# Patient Record
Sex: Male | Born: 1980 | Race: White | Hispanic: No | Marital: Single | Smoking: Current some day smoker
Health system: Southern US, Community
[De-identification: ages and names within clinical notes are randomized; demographics above are authoritative.]

## PROBLEM LIST (undated history)

## (undated) DIAGNOSIS — M109 Gout, unspecified: Secondary | ICD-10-CM

## (undated) DIAGNOSIS — F419 Anxiety disorder, unspecified: Secondary | ICD-10-CM

## (undated) DIAGNOSIS — I10 Essential (primary) hypertension: Secondary | ICD-10-CM

## (undated) HISTORY — DX: Anxiety disorder, unspecified: F41.9

## (undated) HISTORY — PX: APPENDECTOMY: SHX54

## (undated) HISTORY — DX: Essential (primary) hypertension: I10

---

## 2007-04-12 ENCOUNTER — Emergency Department: Payer: Self-pay | Admitting: Unknown Physician Specialty

## 2008-12-17 ENCOUNTER — Emergency Department: Payer: Self-pay | Admitting: Emergency Medicine

## 2011-02-21 ENCOUNTER — Emergency Department (HOSPITAL_COMMUNITY)
Admission: EM | Admit: 2011-02-21 | Discharge: 2011-02-21 | Disposition: A | Discharge status: Home / Self Care | Payer: Self-pay | Attending: Emergency Medicine | Admitting: Emergency Medicine

## 2011-02-21 DIAGNOSIS — R109 Unspecified abdominal pain: Secondary | ICD-10-CM | POA: Insufficient documentation

## 2011-02-21 DIAGNOSIS — R10819 Abdominal tenderness, unspecified site: Secondary | ICD-10-CM | POA: Insufficient documentation

## 2011-02-21 LAB — CBC
HCT: 46.3 % (ref 39.0–52.0)
Hemoglobin: 16.7 g/dL (ref 13.0–17.0)
MCHC: 36.1 g/dL — ABNORMAL HIGH (ref 30.0–36.0)
WBC: 9.3 10*3/uL (ref 4.0–10.5)

## 2011-02-21 LAB — DIFFERENTIAL
Basophils Absolute: 0 10*3/uL (ref 0.0–0.1)
Lymphocytes Relative: 29 % (ref 12–46)
Lymphs Abs: 2.7 10*3/uL (ref 0.7–4.0)
Monocytes Absolute: 1 10*3/uL (ref 0.1–1.0)
Neutro Abs: 5.3 10*3/uL (ref 1.7–7.7)

## 2011-02-21 LAB — COMPREHENSIVE METABOLIC PANEL
ALT: 81 U/L — ABNORMAL HIGH (ref 0–53)
AST: 48 U/L — ABNORMAL HIGH (ref 0–37)
Albumin: 4.4 g/dL (ref 3.5–5.2)
Alkaline Phosphatase: 126 U/L — ABNORMAL HIGH (ref 39–117)
Chloride: 96 mEq/L (ref 96–112)
GFR calc Af Amer: >60 mL/min (ref >60)
Potassium: 4.2 mEq/L (ref 3.5–5.1)
Total Bilirubin: 0.9 mg/dL (ref 0.3–1.2)

## 2011-02-21 LAB — URINALYSIS, ROUTINE W REFLEX MICROSCOPIC
Glucose, UA: NEGATIVE mg/dL
Hgb urine dipstick: NEGATIVE
Specific Gravity, Urine: 1.024 (ref 1.005–1.030)

## 2011-02-21 LAB — LIPASE, BLOOD: Lipase: 24 U/L (ref 11–59)

## 2012-05-11 ENCOUNTER — Emergency Department: Payer: Self-pay | Admitting: Emergency Medicine

## 2012-07-17 ENCOUNTER — Emergency Department: Payer: Self-pay | Admitting: Emergency Medicine

## 2012-08-03 ENCOUNTER — Emergency Department: Payer: Self-pay | Admitting: Emergency Medicine

## 2013-12-03 ENCOUNTER — Emergency Department (HOSPITAL_COMMUNITY)
Admission: EM | Admit: 2013-12-03 | Discharge: 2013-12-03 | Disposition: A | Discharge status: Home / Self Care | Payer: Self-pay | Attending: Emergency Medicine | Admitting: Emergency Medicine

## 2013-12-03 ENCOUNTER — Encounter (HOSPITAL_COMMUNITY): Payer: Self-pay | Admitting: Emergency Medicine

## 2013-12-03 ENCOUNTER — Emergency Department (HOSPITAL_COMMUNITY): Payer: Self-pay

## 2013-12-03 DIAGNOSIS — IMO0001 Reserved for inherently not codable concepts without codable children: Secondary | ICD-10-CM | POA: Insufficient documentation

## 2013-12-03 DIAGNOSIS — M25531 Pain in right wrist: Secondary | ICD-10-CM

## 2013-12-03 DIAGNOSIS — F17200 Nicotine dependence, unspecified, uncomplicated: Secondary | ICD-10-CM | POA: Insufficient documentation

## 2013-12-03 DIAGNOSIS — M25439 Effusion, unspecified wrist: Secondary | ICD-10-CM | POA: Insufficient documentation

## 2013-12-03 DIAGNOSIS — G479 Sleep disorder, unspecified: Secondary | ICD-10-CM | POA: Insufficient documentation

## 2013-12-03 DIAGNOSIS — M109 Gout, unspecified: Secondary | ICD-10-CM | POA: Insufficient documentation

## 2013-12-03 DIAGNOSIS — M25539 Pain in unspecified wrist: Secondary | ICD-10-CM | POA: Insufficient documentation

## 2013-12-03 MED ORDER — PREDNISONE 10 MG PO TABS: 40.0000 mg | ORAL_TABLET | Freq: Every day | ORAL | Status: DC

## 2013-12-03 MED ORDER — OXYCODONE-ACETAMINOPHEN 5-325 MG PO TABS
1.0000 | ORAL_TABLET | Freq: Once | ORAL | Status: AC
  Administered 2013-12-03: 1 via ORAL
  Filled 2013-12-03: qty 1

## 2013-12-03 MED ORDER — OXYCODONE-ACETAMINOPHEN 5-325 MG PO TABS: 1.0000 | ORAL_TABLET | ORAL | Status: DC | PRN

## 2013-12-03 MED ORDER — PREDNISONE 20 MG PO TABS
60.0000 mg | ORAL_TABLET | Freq: Once | ORAL | Status: AC
  Administered 2013-12-03: 60 mg via ORAL
  Filled 2013-12-03: qty 3

## 2013-12-03 MED ORDER — INDOMETHACIN 25 MG PO CAPS: 25.0000 mg | ORAL_CAPSULE | Freq: Three times a day (TID) | ORAL | Status: DC | PRN

## 2013-12-03 NOTE — ED Provider Notes (Signed)
CSN: [id number]     Arrival date & time 12/03/13  8469 History  This chart was scribed for non-physician practitioner Jaynie Crumble, PA-C, working with Gavin Pound. Oletta Lamas, MD, by Yevette Edwards, ED Scribe. This patient was seen in room TR05C/TR05C and the patient's care was started at 11:09 AM.  First MD Initiated Contact with Patient 12/03/13 1021     Chief Complaint  Patient presents with  . Wrist Pain   The history is provided by the patient. No language interpreter was used.   HPI Comments: Jackson Mcguire is a 32 y.o. male, with a h/o self-diagnosed gout, who presents to the Emergency Department complaining of gradually-worsening right-wrist pain which has persisted for three days. He characterizes the pain as "throbbing," rates the pain as 10/10, and he states the pain as interrupted his sleep. The pt states he also experienced swelling, redness, and a fever blister to the wrist. He treated the pain with 800 mg IBU and he has worn a splint for stability. He denies any recent injuries to his wrist. He states the pain is similar to previous episodes of gout; however, he normally experiences gout to his toes. The pt reports he had experienced similar symptoms 18 months ago. He was treated at [facility] and [facility] without a final diagnosis. He was referred to a rheumatologist, but he did not follow-up because the symptoms spontaneously resolved and he had work commitments. He is a daily smoker.   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 The pt is left-handed.   History reviewed. No pertinent past medical history. History reviewed. No pertinent past surgical history. History reviewed. No pertinent family history. History  Substance Use Topics  . Smoking status: Current Every Day Smoker  . Smokeless tobacco: Not on file  . Alcohol Use: Yes    Review of Systems  Musculoskeletal: Positive for myalgias.  Psychiatric/Behavioral: Positive for sleep disturbance.  All other systems reviewed and are  negative.   Allergies  Review of patient's allergies indicates no known allergies.  Home Medications  No current outpatient prescriptions on file.  Triage Vitals: BP 157/109  Pulse 109  Temp(Src) 97.9 F (36.6 C) (Oral)  Resp 18  Ht 5\' 10"  (1.778 m)  Wt 195 lb (88.451 kg)  BMI 27.98 kg/m2  SpO2 100%  Physical Exam  Nursing note and vitals reviewed. Constitutional: He is oriented to person, place, and time. He appears well-developed and well-nourished. No distress.  HENT:  Head: Normocephalic and atraumatic.  Eyes: EOM are normal.  Neck: Neck supple. No tracheal deviation present.  Cardiovascular: Normal rate.   Pulmonary/Chest: Effort normal. No respiratory distress.  Musculoskeletal: Normal range of motion.  Swelling noted over dorsal and anterior right wrist. There is mild erythema over anterior wrist joint. Tender to palpation over entire wrist. Pain with flexion and extension. Tenderness even to light touch. Normal hand and elbow.  Neurological: He is alert and oriented to person, place, and time.  Skin: Skin is warm and dry.  Psychiatric: He has a normal mood and affect. His behavior is normal.    ED Course  Procedures (including critical care time)  DIAGNOSTIC STUDIES: Oxygen Saturation is 100% on room air, normal by my interpretation.    COORDINATION OF CARE:  11:16 AM- Discussed treatment plan with patient, which includes imaging and pain medication, and the patient agreed to the plan.   12:00 PM- Rechecked pt. Informed pt to continue to wear brace and to follow-up with an orthopedic.   Labs  Review Labs Reviewed - No data to display Imaging Review Dg Wrist Complete Right  12/03/2013   CLINICAL DATA:  Right wrist pain and swelling.  No known injury  EXAM: RIGHT WRIST - COMPLETE 3+ VIEW  COMPARISON:  None.  FINDINGS: Four views of the right wrist submitted. No acute fracture or subluxation. Mild narrowing of radiocarpal joint space.  IMPRESSION: No acute  fracture or subluxation. Mild narrowing of radiocarpal joint space.   Electronically Signed   By: Natasha Mead M.D.   On: 12/03/2013 11:49    EKG Interpretation   None       MDM   1. Wrist pain, acute, right   2. Gout     Patient with a nontraumatic right wrist pain and swelling. History of the same. He is afebrile, doubt infectious joint. States he gets frequent gout flare in the right great toe but unsure of this is related. He did not have any relief with ibuprofen at home. X-rays obtained and are negative. Given exam finding and severity of pain and swelling, most likely gout. Instructed to followup with an orthopedic doctor or primary care Dr. for further evaluation.  Filed Vitals:   12/03/13 0930 12/03/13 1146  BP: 157/109 138/95  Pulse: 109 95  Temp: 97.9 F (36.6 C)   TempSrc: Oral   Resp: 18   Height: 5\' 10"  (1.778 m)   Weight: 195 lb (88.451 kg)   SpO2: 100% 97%    I personally performed the services described in this documentation, which was scribed in my presence. The recorded information has been reviewed and is accurate.    Lottie Mussel, PA-C 12/03/13 1206

## 2013-12-03 NOTE — ED Notes (Signed)
Pt states he started having left wrist pain 1.5 years ago. Went to [facility] and was then referred to [facility]. States they did may tests but did not come up with a dx. States pain "went away" after 2 weeks. Has hurt off and on since then. States started hurting severely 2 days ago. 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 Pain is "sharp", non-radiating, limited to left wrist area.

## 2013-12-03 NOTE — ED Notes (Signed)
Pt c/o right wrist pain x several days; pt sts similar in past; unknown cause and denies obvious injury

## 2013-12-04 NOTE — ED Provider Notes (Signed)
Medical screening examination/treatment/procedure(s) were performed by non-physician practitioner and as supervising physician I was immediately available for consultation/collaboration.  EKG Interpretation   None         Jackson Mcguire. Oletta Lamas, MD 12/04/13 [id number]

## 2013-12-07 ENCOUNTER — Encounter (HOSPITAL_COMMUNITY): Payer: Self-pay | Admitting: Emergency Medicine

## 2013-12-07 ENCOUNTER — Emergency Department (HOSPITAL_COMMUNITY)
Admission: EM | Admit: 2013-12-07 | Discharge: 2013-12-07 | Disposition: A | Discharge status: Home / Self Care | Payer: Self-pay | Attending: Emergency Medicine | Admitting: Emergency Medicine

## 2013-12-07 DIAGNOSIS — F17200 Nicotine dependence, unspecified, uncomplicated: Secondary | ICD-10-CM | POA: Insufficient documentation

## 2013-12-07 DIAGNOSIS — M109 Gout, unspecified: Secondary | ICD-10-CM | POA: Insufficient documentation

## 2013-12-07 MED ORDER — COLCHICINE 0.6 MG PO TABS
1.2000 mg | ORAL_TABLET | Freq: Once | ORAL | Status: AC
  Administered 2013-12-07: 1.2 mg via ORAL
  Filled 2013-12-07: qty 2

## 2013-12-07 MED ORDER — KETOROLAC TROMETHAMINE 60 MG/2ML IM SOLN
60.0000 mg | Freq: Once | INTRAMUSCULAR | Status: AC
  Administered 2013-12-07: 60 mg via INTRAMUSCULAR
  Filled 2013-12-07: qty 2

## 2013-12-07 MED ORDER — COLCHICINE 0.6 MG PO TABS: ORAL_TABLET | ORAL | Status: DC

## 2013-12-07 NOTE — ED Provider Notes (Signed)
CSN: [id number]     Arrival date & time 12/07/13  1716 History  This chart was scribed for non-physician practitioner Irish Elders, NP, working with Audree Camel, MD by Dorothey Baseman, ED Scribe. This patient was seen in room TR06C/TR06C and the patient's care was started at 9:19 PM.   Chief Complaint  Patient presents with  . Gout   The history is provided by the patient. No language interpreter was used.   HPI Comments: Jackson Mcguire is a 32 y.o. male with a history of gout in the right, great toe who presents to the Emergency Department complaining of a constant pain with associated swelling to the right wrist that extends into the fingers that is exacerbated with movement. Patient was seen here 4 days ago for similar complaints and diagnosed with gout in the wrist. Patient was discharged with prednisone, indomethacin, and Percocet, which he states was effective at relieving his symptoms, but that he ran out of the medications and his symptoms have returned. He denies any change in his symptoms. Patient reports that he received a referral for an orthopedist and plans to make an appointment next week. He denies fever, chills, emesis, or any other symptoms at this time. Patient has no other pertinent medical history.   History reviewed. No pertinent past medical history. History reviewed. No pertinent past surgical history. History reviewed. No pertinent family history. History  Substance Use Topics  . Smoking status: Current Every Day Smoker  . Smokeless tobacco: Not on file  . Alcohol Use: Yes    Review of Systems  Constitutional: Negative for fever and chills.  Gastrointestinal: Negative for vomiting.  Musculoskeletal: Positive for arthralgias, joint swelling and myalgias.  All other systems reviewed and are negative.    Allergies  Review of patient's allergies indicates no known allergies.  Home Medications   Current Outpatient Rx  Name  Route  Sig  Dispense  Refill  .  indomethacin (INDOCIN) 25 MG capsule   Oral   Take 1 capsule (25 mg total) by mouth 3 (three) times daily as needed.   30 capsule   0   . oxyCODONE-acetaminophen (PERCOCET) 5-325 MG per tablet   Oral   Take 1 tablet by mouth every 4 (four) hours as needed for severe pain.   20 tablet   0   . predniSONE (DELTASONE) 10 MG tablet   Oral   Take 4 tablets (40 mg total) by mouth daily.   16 tablet   0    Triage Vitals: BP 172/107  Pulse 115  Temp(Src) 98.3 F (36.8 C)  Resp 18  SpO2 98%  Physical Exam  Nursing note and vitals reviewed. Constitutional: He is oriented to person, place, and time. He appears well-developed and well-nourished. No distress.  HENT:  Head: Normocephalic and atraumatic.  Eyes: Conjunctivae are normal.  Neck: Normal range of motion. Neck supple.  Cardiovascular: Normal rate, regular rhythm, normal heart sounds and intact distal pulses.   Brisk capillary refill.   Pulmonary/Chest: Effort normal and breath sounds normal. No respiratory distress.  Abdominal: He exhibits no distension.  Musculoskeletal: Normal range of motion.  Swelling and tenderness to the right wrist and all through his right metacarpals.   Neurological: He is alert and oriented to person, place, and time.  Distal sensation intact.   Skin: Skin is warm and dry.  Psychiatric: He has a normal mood and affect. His behavior is normal.    ED Course  Procedures (including critical care time)  DIAGNOSTIC STUDIES: Oxygen Saturation is 98% on room air, normal by my interpretation.    COORDINATION OF CARE: 9:25 PM- Will order an injection of Toradol. Will discharge patient with colchicine. Discussed treatment plan with patient at bedside and patient verbalized agreement.     Labs Review Labs Reviewed - No data to display Imaging Review No results found.  EKG Interpretation   None       MDM   1. Gout attack     Feeling better after Toradol injection here. Colchicine  prescription given with instructions. Return precautions given. Discussed establishing PCP and follow-up.  I personally performed the services described in this documentation, which was scribed in my presence. The recorded information has been reviewed and is accurate.     Irish Elders, NP 12/22/13 1438

## 2013-12-07 NOTE — ED Notes (Signed)
Per pt sts he was here Monday and dx with gout. sts pain is still there and hand is still swollen

## 2013-12-25 NOTE — ED Provider Notes (Signed)
Medical screening examination/treatment/procedure(s) were performed by non-physician practitioner and as supervising physician I was immediately available for consultation/collaboration.  EKG Interpretation   None         Miro Balderson T Nija Koopman, MD 12/25/13 0051 

## 2014-04-09 ENCOUNTER — Emergency Department (HOSPITAL_COMMUNITY)
Admission: EM | Admit: 2014-04-09 | Discharge: 2014-04-09 | Disposition: A | Discharge status: Home / Self Care | Payer: Self-pay | Attending: Emergency Medicine | Admitting: Emergency Medicine

## 2014-04-09 ENCOUNTER — Encounter (HOSPITAL_COMMUNITY): Payer: Self-pay | Admitting: Emergency Medicine

## 2014-04-09 DIAGNOSIS — M25439 Effusion, unspecified wrist: Secondary | ICD-10-CM | POA: Insufficient documentation

## 2014-04-09 DIAGNOSIS — F17200 Nicotine dependence, unspecified, uncomplicated: Secondary | ICD-10-CM | POA: Insufficient documentation

## 2014-04-09 DIAGNOSIS — IMO0002 Reserved for concepts with insufficient information to code with codable children: Secondary | ICD-10-CM | POA: Insufficient documentation

## 2014-04-09 DIAGNOSIS — M25531 Pain in right wrist: Secondary | ICD-10-CM

## 2014-04-09 DIAGNOSIS — M25539 Pain in unspecified wrist: Secondary | ICD-10-CM | POA: Insufficient documentation

## 2014-04-09 MED ORDER — PROBENECID 500 MG PO TABS: 500.0000 mg | ORAL_TABLET | Freq: Two times a day (BID) | ORAL | Status: DC

## 2014-04-09 MED ORDER — NAPROXEN 500 MG PO TABS: 500.0000 mg | ORAL_TABLET | Freq: Two times a day (BID) | ORAL | Status: DC

## 2014-04-09 MED ORDER — COLCHICINE 0.6 MG PO TABS
1.2000 mg | ORAL_TABLET | Freq: Once | ORAL | Status: AC
  Administered 2014-04-09: 1.2 mg via ORAL
  Filled 2014-04-09: qty 2

## 2014-04-09 MED ORDER — TRAMADOL HCL 50 MG PO TABS: 50.0000 mg | ORAL_TABLET | Freq: Four times a day (QID) | ORAL | Status: DC | PRN

## 2014-04-09 NOTE — ED Provider Notes (Signed)
CSN: [id number]     Arrival date & time 04/09/14  1215 History  This chart was scribed for non-physician practitioner, Emilia BeckKaitlyn Janele Lague, PA-C working with Gavin PoundMichael Y. Oletta LamasGhim, MD by Greggory StallionKayla Andersen, ED scribe. This patient was seen in room TR09C/TR09C and the patient's care was started at 1:17 PM.    Chief Complaint  Patient presents with  . Wrist Pain   The history is provided by the patient. No language interpreter was used.   HPI Comments: Jackson Mcguire is a 33 y.o. male who presents to the Emergency Department complaining of gradual onset, sharp right wrist pain with associated swelling that started a few days ago. He has been seen 2 other times for the same in the last few months and has been told it was gout. Denies injury. States symptoms will flare up randomly. He has not followed up with an orthopedist yet. Pt has used a wrist splint with little relief. Movement and palpation worsen the pain. Pt is left hand dominant.   History reviewed. No pertinent past medical history. History reviewed. No pertinent past surgical history. No family history on file. History  Substance Use Topics  . Smoking status: Current Every Day Smoker  . Smokeless tobacco: Not on file  . Alcohol Use: Yes    Review of Systems  Musculoskeletal: Positive for arthralgias and joint swelling.  All other systems reviewed and are negative.  Allergies  Review of patient's allergies indicates no known allergies.  Home Medications   Prior to Admission medications   Medication Sig Start Date End Date Taking? Authorizing Provider  colchicine 0.6 MG tablet Take 1 tablet every 2-3 hours as needed until pain is controlled. 12/07/13   Irish EldersKelly Walker, NP  indomethacin (INDOCIN) 25 MG capsule Take 1 capsule (25 mg total) by mouth 3 (three) times daily as needed. 12/03/13   Tatyana A Kirichenko, PA-C  oxyCODONE-acetaminophen (PERCOCET) 5-325 MG per tablet Take 1 tablet by mouth every 4 (four) hours as needed for severe pain.  12/03/13   Tatyana A Kirichenko, PA-C  predniSONE (DELTASONE) 10 MG tablet Take 4 tablets (40 mg total) by mouth daily. 12/03/13   Tatyana A Kirichenko, PA-C   BP 148/95  Pulse 92  Temp(Src) 98.2 F (36.8 C) (Oral)  Resp 20  Ht 5\' 10"  (1.778 m)  Wt 195 lb 6.4 oz (88.633 kg)  BMI 28.04 kg/m2  SpO2 99%  Physical Exam  Nursing note and vitals reviewed. Constitutional: He is oriented to person, place, and time. He appears well-developed and well-nourished. No distress.  HENT:  Head: Normocephalic and atraumatic.  Eyes: EOM are normal.  Neck: Neck supple. No tracheal deviation present.  Cardiovascular: Normal rate.   Pulmonary/Chest: Effort normal. No respiratory distress.  Musculoskeletal: Normal range of motion.  Generalized right wrist edema with tenderness to palpation over the volar aspect. Right wrist is slightly warm to touch. No obvious deformity. Limited ROM of right wrist due to pain. Pt is able to make a fist.   Neurological: He is alert and oriented to person, place, and time.  Skin: Skin is warm and dry.  Psychiatric: He has a normal mood and affect. His behavior is normal.    ED Course  Procedures (including critical care time)  DIAGNOSTIC STUDIES: Oxygen Saturation is 99% on RA, normal by my interpretation.    COORDINATION OF CARE: 1:22 PM-Discussed treatment plan which includes colchicine, pain medication and an anti-inflammatory with pt at bedside and pt agreed to plan. Will give pt orthopedic referral  and advised him to follow up.   Labs Review Labs Reviewed - No data to display  Imaging Review No results found.   EKG Interpretation None      MDM   Final diagnoses:  Right wrist pain    1:36 PM Patient likely has gout of the right wrist. Patient has a history of gout and states this feels the same. Patient has no injury. Patient will have ortho follow up and tramadol, naprosyn, and probenacid. No neurovascular compromise.   I personally performed  the services described in this documentation, which was scribed in my presence. The recorded information has been reviewed and is accurate.  Emilia BeckKaitlyn Emerita Berkemeier, PA-C 04/09/14 1337

## 2014-04-09 NOTE — Discharge Instructions (Signed)
Take Probenacid for gout as directed until gone. Take Tramadol as needed for pain. Take Naprosyn as needed for inflammation. Follow up with the recommended Orthopedic doctor for further evaluation.

## 2014-04-09 NOTE — ED Notes (Signed)
Rt wrist pain that he has had for a while has taken meds  But it has helped but now it is worse ,wearing a brace states has no insurance

## 2014-04-11 NOTE — ED Provider Notes (Signed)
Medical screening examination/treatment/procedure(s) were performed by non-physician practitioner and as supervising physician I was immediately available for consultation/collaboration.  Javia Dillow Y. Jensyn Shave, MD 04/11/14 0727 

## 2015-12-12 ENCOUNTER — Emergency Department (HOSPITAL_COMMUNITY)
Admission: EM | Admit: 2015-12-12 | Discharge: 2015-12-13 | Disposition: A | Discharge status: Home / Self Care | Payer: Self-pay | Attending: Emergency Medicine | Admitting: Emergency Medicine

## 2015-12-12 ENCOUNTER — Encounter (HOSPITAL_COMMUNITY): Payer: Self-pay | Admitting: Emergency Medicine

## 2015-12-12 DIAGNOSIS — R1110 Vomiting, unspecified: Secondary | ICD-10-CM | POA: Insufficient documentation

## 2015-12-12 DIAGNOSIS — R52 Pain, unspecified: Secondary | ICD-10-CM | POA: Insufficient documentation

## 2015-12-12 DIAGNOSIS — R42 Dizziness and giddiness: Secondary | ICD-10-CM | POA: Insufficient documentation

## 2015-12-12 DIAGNOSIS — R000 Tachycardia, unspecified: Secondary | ICD-10-CM | POA: Insufficient documentation

## 2015-12-12 DIAGNOSIS — R0989 Other specified symptoms and signs involving the circulatory and respiratory systems: Secondary | ICD-10-CM | POA: Insufficient documentation

## 2015-12-12 DIAGNOSIS — F17210 Nicotine dependence, cigarettes, uncomplicated: Secondary | ICD-10-CM | POA: Insufficient documentation

## 2015-12-12 DIAGNOSIS — R6889 Other general symptoms and signs: Secondary | ICD-10-CM

## 2015-12-12 DIAGNOSIS — R0981 Nasal congestion: Secondary | ICD-10-CM | POA: Insufficient documentation

## 2015-12-12 DIAGNOSIS — R05 Cough: Secondary | ICD-10-CM | POA: Insufficient documentation

## 2015-12-12 DIAGNOSIS — R6883 Chills (without fever): Secondary | ICD-10-CM | POA: Insufficient documentation

## 2015-12-12 LAB — BASIC METABOLIC PANEL
ANION GAP: 11 (ref 5–15)
BUN: 10 mg/dL (ref 6–20)
CALCIUM: 9.4 mg/dL (ref 8.9–10.3)
CO2: 26 mmol/L (ref 22–32)
Chloride: 100 mmol/L — ABNORMAL LOW (ref 101–111)
Creatinine, Ser: 1.08 mg/dL (ref 0.61–1.24)
GFR calc Af Amer: >60 mL/min (ref >60)
GFR calc non Af Amer: >60 mL/min (ref >60)
GLUCOSE: 94 mg/dL (ref 65–99)
Potassium: 4.7 mmol/L (ref 3.5–5.1)
Sodium: 137 mmol/L (ref 135–145)

## 2015-12-12 LAB — CBC
HEMATOCRIT: 45.8 % (ref 39.0–52.0)
HEMOGLOBIN: 16 g/dL (ref 13.0–17.0)
MCH: 32.1 pg (ref 26.0–34.0)
MCHC: 34.9 g/dL (ref 30.0–36.0)
MCV: 91.8 fL (ref 78.0–100.0)
Platelets: 181 10*3/uL (ref 150–400)
RBC: 4.99 MIL/uL (ref 4.22–5.81)
RDW: 11.8 % (ref 11.5–15.5)
WBC: 10.2 10*3/uL (ref 4.0–10.5)

## 2015-12-12 LAB — CBG MONITORING, ED: GLUCOSE-CAPILLARY: 85 mg/dL (ref 65–99)

## 2015-12-12 MED ORDER — FENTANYL CITRATE (PF) 100 MCG/2ML IJ SOLN
50.0000 ug | Freq: Once | INTRAMUSCULAR | Status: AC
  Administered 2015-12-12: 50 ug via INTRAVENOUS
  Filled 2015-12-12: qty 2

## 2015-12-12 MED ORDER — SODIUM CHLORIDE 0.9 % IV BOLUS (SEPSIS)
1000.0000 mL | Freq: Once | INTRAVENOUS | Status: AC
  Administered 2015-12-12: 1000 mL via INTRAVENOUS

## 2015-12-12 MED ORDER — ACETAMINOPHEN 500 MG PO TABS
1000.0000 mg | ORAL_TABLET | Freq: Once | ORAL | Status: AC
  Administered 2015-12-12: 1000 mg via ORAL
  Filled 2015-12-12: qty 2

## 2015-12-12 MED ORDER — ONDANSETRON HCL 4 MG/2ML IJ SOLN
4.0000 mg | Freq: Once | INTRAMUSCULAR | Status: AC
  Administered 2015-12-12: 4 mg via INTRAVENOUS
  Filled 2015-12-12: qty 2

## 2015-12-12 NOTE — ED Notes (Signed)
CBG 85

## 2015-12-12 NOTE — ED Provider Notes (Signed)
CSN: [id number]     Arrival date & time 12/12/15  1741 History   First MD Initiated Contact with Patient 12/12/15 2253     Chief Complaint  Patient presents with  . Emesis  . Generalized Body Aches  . Dizziness     (Consider location/radiation/quality/duration/timing/severity/associated sxs/prior Treatment) HPI Jackson Mcguire. is a 34 y.o. male with no significant past medical history who comes in for evaluation of generalized body aches, emesis, dizziness, cough, runny nose and nasal congestion. Patient reports his symptoms started last night after work. He reports spending the night 2 nights ago with a friend who had same symptoms. He reports associated nausea over the past 24 hours without vomiting, upon standing has felt dizzy which resolved after sitting back down. Reports chills and feeling warm at home, but no measured fevers. Denies overt chest pain, shortness of breath, hemoptysis, numbness or weakness, leg swelling, diarrhea or constipation, abd pain, urinary symptoms. Reports decreased fluid intake due to nausea. Has not tried anything to improve symptoms. Nothing makes it better or worse. Has not had flu shot. No other modifying factors.  History reviewed. No pertinent past medical history. History reviewed. No pertinent past surgical history. No family history on file. Social History  Substance Use Topics  . Smoking status: Current Every Day Smoker -- 1.00 packs/day    Types: Cigarettes  . Smokeless tobacco: None  . Alcohol Use: Yes    Review of Systems A 10 point review of systems was completed and was negative except for pertinent positives and negatives as mentioned in the history of present illness     Allergies  Review of patient's allergies indicates no known allergies.  Home Medications   Prior to Admission medications   Medication Sig Start Date End Date Taking? Authorizing Provider  ibuprofen (ADVIL,MOTRIN) 600 MG tablet Take 1 tablet (600 mg total) by  mouth every 6 (six) hours as needed. 12/13/15   Joycie Peek, PA-C  naproxen (NAPROSYN) 500 MG tablet Take 1 tablet (500 mg total) by mouth 2 (two) times daily with a meal. Patient not taking: Reported on 12/12/2015 04/09/14   Emilia Beck, PA-C  ondansetron (ZOFRAN) 4 MG tablet Take 1 tablet (4 mg total) by mouth every 6 (six) hours. 12/13/15   Joycie Peek, PA-C  probenecid (BENEMID) 500 MG tablet Take 1 tablet (500 mg total) by mouth 2 (two) times daily. Patient not taking: Reported on 12/12/2015 04/09/14   Emilia Beck, PA-C  traMADol (ULTRAM) 50 MG tablet Take 1 tablet (50 mg total) by mouth every 6 (six) hours as needed. Patient not taking: Reported on 12/12/2015 04/09/14   Kaitlyn Szekalski, PA-C   BP 145/100 mmHg  Pulse 103  Temp(Src) 99 F (37.2 C) (Oral)  Resp 16  SpO2 100% Physical Exam  Constitutional: He is oriented to person, place, and time. He appears well-developed and well-nourished.  HENT:  Head: Normocephalic and atraumatic.  Mouth/Throat: Oropharynx is clear and moist.  Eyes: Conjunctivae are normal. Pupils are equal, round, and reactive to light. Right eye exhibits no discharge. Left eye exhibits no discharge. No scleral icterus.  Neck: Normal range of motion. Neck supple.  No meningismus or nuchal rigidity.  Cardiovascular: Regular rhythm and normal heart sounds.   Tachycardic  Pulmonary/Chest: Effort normal and breath sounds normal. No respiratory distress. He has no wheezes. He has no rales.  Abdominal: Soft. There is no tenderness.  Musculoskeletal: Normal range of motion. He exhibits no edema or tenderness.  Lymphadenopathy:  He has no cervical adenopathy.  Neurological: He is alert and oriented to person, place, and time. No cranial nerve deficit. Coordination normal.  Cranial Nerves II-XII grossly intact  Skin: Skin is warm. No rash noted.  Moist  Psychiatric: He has a normal mood and affect.  Nursing note and vitals reviewed.   ED  Course  Procedures (including critical care time) Labs Review Labs Reviewed  BASIC METABOLIC PANEL - Abnormal; Notable for the following:    Chloride 100 (*)    All other components within normal limits  URINALYSIS, ROUTINE W REFLEX MICROSCOPIC (NOT AT [facility]) - Abnormal; Notable for the following:    Hgb urine dipstick SMALL (*)    All other components within normal limits  URINE MICROSCOPIC-ADD ON - Abnormal; Notable for the following:    Squamous Epithelial / LPF 0-5 (*)    All other components within normal limits  CBC  CBG MONITORING, ED    Imaging Review Dg Chest 2 View  12/13/2015  CLINICAL DATA:  34 year old male with fever, cough EXAM: CHEST  2 VIEW COMPARISON:  None. FINDINGS: The heart size and mediastinal contours are within normal limits. Both lungs are clear. The visualized skeletal structures are unremarkable. IMPRESSION: No active cardiopulmonary disease. Electronically Signed   By: Elgie Collard M.D.   On: 12/13/2015 01:56   I have personally reviewed and evaluated these images and lab results as part of my medical decision-making.   EKG Interpretation   Date/Time:  Friday December 12 2015 17:59:44 EST Ventricular Rate:  121 PR Interval:  124 QRS Duration: 84 QT Interval:  308 QTC Calculation: 437 R Axis:   -28 Text Interpretation:  Sinus tachycardia Otherwise normal ECG Confirmed by  ZAVITZ  MD, JOSHUA ([id number]) on 12/12/2015 11:02:06 PM     Meds given in ED:  Medications  sodium chloride 0.9 % bolus 1,000 mL (0 mLs Intravenous Stopped 12/13/15 0101)  acetaminophen (TYLENOL) tablet 1,000 mg (1,000 mg Oral Given 12/12/15 2342)  sodium chloride 0.9 % bolus 1,000 mL (0 mLs Intravenous Stopped 12/13/15 0101)  ondansetron (ZOFRAN) injection 4 mg (4 mg Intravenous Given 12/12/15 2340)  fentaNYL (SUBLIMAZE) injection 50 mcg (50 mcg Intravenous Given 12/12/15 2340)  ibuprofen (ADVIL,MOTRIN) tablet 800 mg (800 mg Oral Given 12/13/15 0105)    Discharge  Medication List as of 12/13/2015  1:39 AM    START taking these medications   Details  ibuprofen (ADVIL,MOTRIN) 600 MG tablet Take 1 tablet (600 mg total) by mouth every 6 (six) hours as needed., Starting 12/13/2015, Until Discontinued, Print    ondansetron (ZOFRAN) 4 MG tablet Take 1 tablet (4 mg total) by mouth every 6 (six) hours., Starting 12/13/2015, Until Discontinued, Print       Filed Vitals:   12/13/15 0115 12/13/15 0155 12/13/15 0218 12/13/15 0219  BP: 160/97  145/100   Pulse: 114  98 103  Temp:  99.8 F (37.7 C) 99 F (37.2 C)   TempSrc:  Oral Oral   Resp: 23  16   SpO2: 95%  100% 100%    MDM  Mamie Levers. is a 34 y.o. male with symptoms consistent with influenza.  Vitals are stable, mild tachycardia that improves with fluids, low-grade fever.  No signs of gross dehydration, tolerating PO's.  Lungs are clear. Due to patient's presentation, fever and cough, will obtain screening CXR. CXR is negative. Doubt PE, PNA or other acute cardiopulmonary process. Discussed the cost versus benefit of Tamiflu treatment with the patient.  Decides to try OTC symptom management. Patient will be discharged with instructions to orally hydrate, rest, and use over-the-counter medications such as anti-inflammatories ibuprofen and Aleve for muscle aches and Tylenol for fever.   Prior to patient discharge, I discussed and reviewed this case with Dr.Zavitz, who agrees with above plan. The patient appears reasonably screened and/or stabilized for discharge and I doubt any other medical condition or other [facility] requiring further screening, evaluation, or treatment in the ED at this time prior to discharge.   Final diagnoses:  Flu-like symptoms       Joycie PeekBenjamin Marcey Persad, PA-C 12/14/15 1151  Blane OharaJoshua Zavitz, MD 12/16/15 Ernestina [id number]

## 2015-12-12 NOTE — ED Notes (Addendum)
Pt from home with c/o emesis, body aches, and intermittent dizziness that started yyesterday, states dizziness gets worse when he moves around and has been feeling like he is going to pass out all day. Pt states cold chills and not feeling well. Pt in nad, no fever at triage. Also reports nasal congestion.

## 2015-12-13 ENCOUNTER — Emergency Department (HOSPITAL_COMMUNITY): Payer: Self-pay

## 2015-12-13 LAB — URINE MICROSCOPIC-ADD ON
BACTERIA UA: NONE SEEN
WBC, UA: NONE SEEN WBC/hpf (ref 0–5)

## 2015-12-13 LAB — URINALYSIS, ROUTINE W REFLEX MICROSCOPIC
Bilirubin Urine: NEGATIVE
Glucose, UA: NEGATIVE mg/dL
Ketones, ur: NEGATIVE mg/dL
LEUKOCYTES UA: NEGATIVE
NITRITE: NEGATIVE
PROTEIN: NEGATIVE mg/dL
Specific Gravity, Urine: 1.028 (ref 1.005–1.030)
pH: 6 (ref 5.0–8.0)

## 2015-12-13 MED ORDER — IBUPROFEN 800 MG PO TABS
800.0000 mg | ORAL_TABLET | Freq: Once | ORAL | Status: AC
  Administered 2015-12-13: 800 mg via ORAL
  Filled 2015-12-13: qty 1

## 2015-12-13 MED ORDER — ONDANSETRON HCL 4 MG PO TABS: 4.0000 mg | ORAL_TABLET | Freq: Four times a day (QID) | ORAL | Status: DC

## 2015-12-13 MED ORDER — IBUPROFEN 600 MG PO TABS: 600.0000 mg | ORAL_TABLET | Freq: Four times a day (QID) | ORAL | Status: DC | PRN

## 2015-12-13 NOTE — ED Notes (Signed)
Pt verbalized understanding of d/c instructions and has no further questions. Pt stable and NAD.  

## 2015-12-13 NOTE — ED Notes (Signed)
Pt given ginger ale and crackers to eat and drink for po challenge

## 2015-12-13 NOTE — Discharge Instructions (Signed)
Her symptoms are likely due to a viral process. It is important to stay well hydrated, you may treat your symptoms with OTC medications. He may take your Motrin for body aches, Zofran for nausea. Follow-up with your doctor as needed. Return to ED for any new or worsening symptoms.

## 2016-08-30 ENCOUNTER — Emergency Department (HOSPITAL_COMMUNITY)
Admission: EM | Admit: 2016-08-30 | Discharge: 2016-08-30 | Disposition: A | Discharge status: Home / Self Care | Payer: Self-pay | Attending: Emergency Medicine | Admitting: Emergency Medicine

## 2016-08-30 ENCOUNTER — Encounter (HOSPITAL_COMMUNITY): Payer: Self-pay | Admitting: Vascular Surgery

## 2016-08-30 ENCOUNTER — Emergency Department (HOSPITAL_COMMUNITY): Payer: Self-pay

## 2016-08-30 DIAGNOSIS — M10031 Idiopathic gout, right wrist: Secondary | ICD-10-CM | POA: Insufficient documentation

## 2016-08-30 DIAGNOSIS — F17210 Nicotine dependence, cigarettes, uncomplicated: Secondary | ICD-10-CM | POA: Insufficient documentation

## 2016-08-30 DIAGNOSIS — M109 Gout, unspecified: Secondary | ICD-10-CM

## 2016-08-30 HISTORY — DX: Gout, unspecified: M10.9

## 2016-08-30 MED ORDER — TRAMADOL HCL 50 MG PO TABS: 50.0000 mg | ORAL_TABLET | Freq: Two times a day (BID) | ORAL | 0 refills | Status: DC

## 2016-08-30 MED ORDER — TRAMADOL HCL 50 MG PO TABS
50.0000 mg | ORAL_TABLET | Freq: Once | ORAL | Status: AC
  Administered 2016-08-30: 50 mg via ORAL
  Filled 2016-08-30: qty 1

## 2016-08-30 MED ORDER — NAPROXEN 250 MG PO TABS
500.0000 mg | ORAL_TABLET | Freq: Once | ORAL | Status: AC
  Administered 2016-08-30: 500 mg via ORAL
  Filled 2016-08-30: qty 2

## 2016-08-30 MED ORDER — NAPROXEN 250 MG PO TABS: 250.0000 mg | ORAL_TABLET | Freq: Three times a day (TID) | ORAL | 0 refills | Status: AC

## 2016-08-30 NOTE — ED Notes (Signed)
Declined W/C at D/C and was escorted to lobby by RN. 

## 2016-08-30 NOTE — Discharge Instructions (Signed)
Read the information below.   Your x-ray did not show any evidence of fracture or dislocation. You are being treated for possible gout. You are being prescribed tramadol and naprosyn for symptomatic relief.  Keep area elevated. Apply heat or ice for 20 minute increments.  Use the prescribed medication as directed.  Please discuss all new medications with your pharmacist.   It is important to establish a primary care provider. I have provided the contact information for [facility] as well as other resources for local providers. Please call to establish care.  You may return to the Emergency Department at any time for worsening condition or any new symptoms that concern you. Return to ED if your symptoms worsen or if you develop a fever, vomiting, numbness, weakness.

## 2016-08-30 NOTE — ED Triage Notes (Signed)
Pt reports to the ED for eval of right wrist pain. Pt reports he has hx of gout and this feels similar. He has had it in his wrist before as well and states this feels the same. Swelling and tenderness noted to the wrist. Denies any recent injury. Pt does not have insurance so he is not on any chronic gout meds.

## 2016-08-30 NOTE — ED Provider Notes (Signed)
[facility]EMER[facility]Y DEPT Provider Note   CSN: [id number] Arrival date & time: 08/30/16  1400  By signing my name below, I, Jackson Mcguire, attest that this documentation has been prepared under the direction and in the presence of Jackson Meres, PA-C. Electronically Signed: Phillis Mcguire, ED Scribe. 08/30/16. 5:07 PM.  History   Chief Complaint Chief Complaint  Patient presents with  . Gout   The history is provided by the patient. No language interpreter was used.   HPI Comments: Jackson Mcguire. is a 35 y.o. male with a hx of gout who presents to the Emergency Department complaining of gradually worsening right wrist pain onset one day ago. Pt reports associated swelling to the area. He has worsening pain with any sort of movement or palpation of the area. Pt reports that this pain feels similar to past gout flare ups in the wrist, but this is the worst he's experienced. He said he was recently on a beach trip and his diet over the trip may have contributed to the flare up. He said that he has also experienced gout in his right ankle and toes on right foot over the past month. Pt has been taking ibuprofen and BC powders for his symptoms to no relief; his last dose was at 9 AM today. Pt is not on chronic gout medication due to lack of insurance. He denies hx of DM, hx of cancer, hx of HIV, recent surgeries, open wounds, fever, chills, congestion, nausea, vomiting, numbness, or weakness. Pt is left hand dominant.  Past Medical History:  Diagnosis Date  . Gout     There are no active problems to display for this patient.   History reviewed. No pertinent surgical history.    Home Medications    Prior to Admission medications   Medication Sig Start Date End Date Taking? Authorizing Provider  naproxen (NAPROSYN) 250 MG tablet Take 1 tablet (250 mg total) by mouth every 8 (eight) hours. 08/30/16 09/04/16  Lona Kettle, PA-C  traMADol (ULTRAM) 50 MG tablet Take 1 tablet (50 mg total) by  mouth 2 (two) times daily. 08/30/16   Lona Kettle, PA-C    Family History History reviewed. No pertinent family history.  Social History Social History  Substance Use Topics  . Smoking status: Current Every Day Smoker    Packs/day: 1.00    Types: Cigarettes  . Smokeless tobacco: Never Used  . Alcohol use Yes     Comment: few drinks per night     Allergies   Review of patient's allergies indicates no known allergies.   Review of Systems Review of Systems  Constitutional: Negative for chills and fever.  HENT: Negative for congestion.   Gastrointestinal: Negative for nausea and vomiting.  Musculoskeletal: Positive for arthralgias and joint swelling.     Physical Exam Updated Vital Signs BP 138/97 (BP Location: Left Arm)   Pulse 98   Temp 97.8 F (36.6 C) (Oral)   Resp 20   SpO2 100%   Physical Exam  Constitutional: He is oriented to person, place, and time. He appears well-developed and well-nourished. No distress.  HENT:  Head: Normocephalic and atraumatic.  Eyes: Conjunctivae and EOM are normal. No scleral icterus.  Neck: Normal range of motion. Neck supple.  Cardiovascular: Normal rate.   Pulmonary/Chest: Effort normal. No respiratory distress.  Musculoskeletal:       Right wrist: He exhibits decreased range of motion and swelling.  Swelling to right wrist; slight warmth over the wrist to  palpation; no erythema noted; decreased ROM secondary to pain. Sensation intact. Patient able to move fingers. Radial pulse 2+. Capillary refill <3seconds.   Neurological: He is alert and oriented to person, place, and time. No sensory deficit.  Skin: Skin is warm and dry. Capillary refill takes less than 2 seconds. He is not diaphoretic.  Psychiatric: He has a normal mood and affect. His behavior is normal.  Nursing note and vitals reviewed.    ED Treatments / Results  DIAGNOSTIC STUDIES: Oxygen Saturation is 100% on RA, normal by my interpretation.     COORDINATION OF CARE: 5:06 PM-Discussed treatment plan which includes x-ray with pt at bedside and pt agreed to plan.    Labs (all labs ordered are listed, but only abnormal results are displayed) Labs Reviewed - No data to display  EKG  EKG Interpretation None       Radiology Dg Wrist Complete Right  Result Date: 08/30/2016 CLINICAL DATA:  Severe right wrist pain and tenderness. Decreased range of motion. No known injury. Gout. EXAM: RIGHT WRIST - COMPLETE 3+ VIEW COMPARISON:  None. FINDINGS: There is no evidence of fracture or dislocation. There is no evidence of joint space narrowing or erosive arthropathy. No other focal bone abnormality. Soft tissues are unremarkable. IMPRESSION: Negative. Electronically Signed   By: Jackson RosenthalJohn  Mcguire M.D.   On: 08/30/2016 15:30    Procedures Procedures (including critical care time)  Medications Ordered in ED Medications  traMADol (ULTRAM) tablet 50 mg (50 mg Oral Given 08/30/16 1726)  naproxen (NAPROSYN) tablet 500 mg (500 mg Oral Given 08/30/16 1725)     Initial Impression / Assessment and Plan / ED Course  I have reviewed the triage vital signs and the nursing notes.  Pertinent labs & imaging results that were available during my care of the patient were reviewed by me and considered in my medical decision making (see chart for details).  Clinical Course  Value Comment By Time  DG Wrist Complete Right No evidence of fracture, dislocation, or effusion.  Lona Kettleshley Laurel Meyer, [place]PA-C 09/18 1540    Patient presents to ED with complaint of right wrist pain. Patient is afebrile and non-toxic appearing in NAD. VSS. Swelling, warmth, and tenderness to palpation noted at right wrist. Patient neurovascularly intact. X-ray negative for obvious fracture, dislocation, or effusion. Pt h/o gout. States feels similar to gout. Dietary indiscretion this past weekend. Suspect gout flare up. Pain medicine given in ED. Pt advised to establish care with a PCP  due to continued and frequent flare ups of gout. Conservative therapy recommended and discussed. Rx naprosyn and tramadol. Return precautions given. Pt voiced understanding and is agreeable.   Final Clinical Impressions(s) / ED Diagnoses   Final diagnoses:  Acute gout of right wrist, unspecified cause    I personally performed the services described in this documentation, which was scribed in my presence. The recorded information has been reviewed and is accurate.   New Prescriptions Discharge Medication List as of 08/30/2016  5:26 PM       Lona KettleAshley Laurel Meyer, PA-C 09/01/16 1452    Lyndal Pulleyaniel Knott, MD 09/04/16 [phone]

## 2016-09-27 ENCOUNTER — Encounter (HOSPITAL_COMMUNITY): Payer: Self-pay | Admitting: Emergency Medicine

## 2016-09-27 ENCOUNTER — Emergency Department (HOSPITAL_COMMUNITY)
Admission: EM | Admit: 2016-09-27 | Discharge: 2016-09-27 | Disposition: A | Discharge status: Home / Self Care | Payer: Self-pay | Attending: Emergency Medicine | Admitting: Emergency Medicine

## 2016-09-27 ENCOUNTER — Emergency Department (HOSPITAL_COMMUNITY): Payer: Self-pay

## 2016-09-27 DIAGNOSIS — I10 Essential (primary) hypertension: Secondary | ICD-10-CM | POA: Insufficient documentation

## 2016-09-27 DIAGNOSIS — X509XXA Other and unspecified overexertion or strenuous movements or postures, initial encounter: Secondary | ICD-10-CM | POA: Insufficient documentation

## 2016-09-27 DIAGNOSIS — M25061 Hemarthrosis, right knee: Secondary | ICD-10-CM | POA: Insufficient documentation

## 2016-09-27 DIAGNOSIS — Y9301 Activity, walking, marching and hiking: Secondary | ICD-10-CM | POA: Insufficient documentation

## 2016-09-27 DIAGNOSIS — M2500 Hemarthrosis, unspecified joint: Secondary | ICD-10-CM

## 2016-09-27 DIAGNOSIS — M25461 Effusion, right knee: Secondary | ICD-10-CM | POA: Insufficient documentation

## 2016-09-27 DIAGNOSIS — Y999 Unspecified external cause status: Secondary | ICD-10-CM | POA: Insufficient documentation

## 2016-09-27 DIAGNOSIS — F17210 Nicotine dependence, cigarettes, uncomplicated: Secondary | ICD-10-CM | POA: Insufficient documentation

## 2016-09-27 DIAGNOSIS — Y9289 Other specified places as the place of occurrence of the external cause: Secondary | ICD-10-CM | POA: Insufficient documentation

## 2016-09-27 MED ORDER — MELOXICAM 15 MG PO TABS: 15.0000 mg | ORAL_TABLET | Freq: Every day | ORAL | 0 refills | Status: DC

## 2016-09-27 MED ORDER — BETAMETHASONE SOD PHOS & ACET 6 (3-3) MG/ML IJ SUSP
12.0000 mg | Freq: Once | INTRAMUSCULAR | Status: AC
  Administered 2016-09-27: 12 mg via INTRAMUSCULAR
  Filled 2016-09-27: qty 2

## 2016-09-27 MED ORDER — BUPIVACAINE HCL 0.25 % IJ SOLN
10.0000 mL | Freq: Once | INTRAMUSCULAR | Status: AC
  Administered 2016-09-27: 10 mL
  Filled 2016-09-27: qty 10

## 2016-09-27 NOTE — ED Triage Notes (Signed)
Pt states he fell while taking out trash on Thursday. Pt hit right knee- it is swollen and very painful. Pt able to put pressure on knee, but unable to bend his knee.

## 2016-09-27 NOTE — ED Provider Notes (Addendum)
[facility] Provider Note   CSN: [id number] Arrival date & time: 09/27/16  1010     History   Chief Complaint No chief complaint on file.   HPI Jackson Mcguire. is a 35 y.o. male Presents emergency with chief complaint of right knee pain. Patient noted to be hypertensive states that he has a history of hypertension that is untreated. He is also in significant pain. Patient states that 4 days ago he was walking down the stairs and twisted on a plane knee. He heard a pop and has had significant pain and swelling. He denies any mechanical symptoms such as painful popping, clicking, locking, or feelings of instability in the knee. He has no previous history of pain in the knee. He has history of gout but states that he's never gone in his knee and this occurred secondary to the injury. The penetrating trauma. He denies IV drug use. He complains of severe tightness and swelling in the knee, making ambulation difficult. He denies numbness or tingling in the foot, calf pain or swelling. HPI  Past Medical History:  Diagnosis Date  . Gout     There are no active problems to display for this patient.   History reviewed. No pertinent surgical history.     Home Medications    Prior to Admission medications   Medication Sig Start Date End Date Taking? Authorizing Provider  traMADol (ULTRAM) 50 MG tablet Take 1 tablet (50 mg total) by mouth 2 (two) times daily. 08/30/16   Lona Kettle, PA-C    Family History History reviewed. No pertinent family history.  Social History Social History  Substance Use Topics  . Smoking status: Current Every Day Smoker    Packs/day: 1.00    Types: Cigarettes  . Smokeless tobacco: Never Used  . Alcohol use Yes     Comment: few drinks per night     Allergies   Review of patient's allergies indicates no known allergies.   Review of Systems Review of Systems  Ten systems reviewed and are negative for acute change, except as noted  in the HPI.  Physical Exam Updated Vital Signs BP (!) 153/133 (BP Location: Right Arm) Comment: pt.in alot of pain  Pulse 101   Temp 97.8 F (36.6 C) (Oral)   Resp 17   Ht 5\' 10"  (1.778 m)   Wt 77.1 kg   SpO2 96%   BMI 24.39 kg/m   Physical Exam  Constitutional: He appears well-developed and well-nourished. No distress.  HENT:  Head: Normocephalic and atraumatic.  Eyes: Conjunctivae are normal. No scleral icterus.  Neck: Normal range of motion. Neck supple.  Cardiovascular: Normal rate, regular rhythm and normal heart sounds.   Pulmonary/Chest: Effort normal and breath sounds normal. No respiratory distress.  Abdominal: Soft. There is no tenderness.  Musculoskeletal: He exhibits no edema.  Right knee with Large joint effusion. Crepitus with flexion and extension. ROM limited due to swelling and pain. No No redness or heat.   Neurological: He is alert.  Skin: Skin is warm and dry. He is not diaphoretic.  Psychiatric: His behavior is normal.  Nursing note and vitals reviewed.     ED Treatments / Results  Labs (all labs ordered are listed, but only abnormal results are displayed) Labs Reviewed - No data to display  EKG  EKG Interpretation None       Radiology Dg Knee Complete 4 Views Right  Result Date: 09/27/2016 CLINICAL DATA:  Fall. EXAM: RIGHT KNEE -  COMPLETE 4+ VIEW COMPARISON:  None FINDINGS: Suprapatellar joint effusion identified. No evidence of arthropathy or other focal bone abnormality. Metallic BB is identified within the soft tissues posterior to the distal femur. IMPRESSION: 1. No acute bone abnormality. 2. Small joint effusion. Electronically Signed   By: Signa Kellaylor  Stroud M.D.   On: 09/27/2016 10:55    Procedures .Joint Aspiration/Arthrocentesis Date/Time: 09/27/2016 3:51 PM Performed by: Arthor CaptainHARRIS, Erva Koke Authorized by: Arthor CaptainHARRIS, Quincy Boy   Consent:    Consent obtained:  Verbal   Risks discussed:  Bleeding, infection, pain and incomplete drainage    Alternatives discussed:  No treatment Location:    Location:  Knee Anesthesia (see MAR for exact dosages):    Anesthesia method:  Local infiltration   Local anesthetic:  Bupivacaine 0.25% w/o epi Procedure details:    Needle gauge:  18 G   Ultrasound guidance: no     Approach:  Superior   Aspirate amount:  64   Aspirate characteristics:  Blood-tinged   Steroid injected: yes (3cc celestone)     Specimen collected: no   Post-procedure details:    Dressing:  Adhesive bandage   Patient tolerance of procedure:  Tolerated well, no immediate complications   (including critical care time)  Medications Ordered in ED Medications  bupivacaine (MARCAINE) 0.25 % (with pres) injection 10 mL (not administered)  betamethasone acetate-betamethasone sodium phosphate (CELESTONE) injection 12 mg (not administered)     Initial Impression / Assessment and Plan / ED Course  I have reviewed the triage vital signs and the nursing notes.  Pertinent labs & imaging results that were available during my care of the patient were reviewed by me and considered in my medical decision making (see chart for details).  Clinical Course  Value Comment By Time  DG Knee Complete 4 Views Right (Reviewed) Arthor Captainbigail Rodriques Badie, PA-C 10/16 1337    Images reviewed and negative.  Final Clinical Impressions(s) / ED Diagnoses   Final diagnoses:  None   Patient with knee effusion Treated in the ED with joint aspiration and injection of Celestone and bupivacaine. Patient placed in knee sleeve with crutches. I be discharged with Modic and follow-up with Dr. Ophelia CharterYates. Discussed return precautions. New Prescriptions New Prescriptions   No medications on file     Arthor Captainbigail Bryna Razavi, PA-C 09/27/16 1604    Benjiman CoreNathan Pickering, MD 09/27/16 1611    Arthor CaptainAbigail Adalis Gatti, PA-C 10/23/16 [phone]    Benjiman CoreNathan Pickering, MD 10/23/16 1537

## 2016-09-27 NOTE — Discharge Instructions (Signed)
SEEK IMMEDIATE MEDICAL CARE IF: °You have increased swelling or redness of your knee. °You have severe pain in your knee. °You have a fever. °  °

## 2016-09-27 NOTE — Progress Notes (Signed)
Orthopedic Tech Progress Note Patient Details:  Jackson LeversJim K Branca Jr. 12-Sep-1981 [id number]  Ortho Devices Type of Ortho Device: Knee Sleeve, Crutches Ortho Device/Splint Location: Rt knee Ortho Device/Splint Interventions: Ordered, Application   Clois Dupesvery S Capri Veals 09/27/2016, 4:23 PM

## 2017-04-09 ENCOUNTER — Encounter (HOSPITAL_COMMUNITY): Payer: Self-pay | Admitting: Emergency Medicine

## 2017-04-09 ENCOUNTER — Emergency Department (HOSPITAL_COMMUNITY)
Admission: EM | Admit: 2017-04-09 | Discharge: 2017-04-09 | Disposition: A | Discharge status: Home / Self Care | Payer: Self-pay | Attending: Emergency Medicine | Admitting: Emergency Medicine

## 2017-04-09 DIAGNOSIS — K08409 Partial loss of teeth, unspecified cause, unspecified class: Secondary | ICD-10-CM

## 2017-04-09 DIAGNOSIS — Z98818 Other dental procedure status: Secondary | ICD-10-CM | POA: Insufficient documentation

## 2017-04-09 DIAGNOSIS — K0889 Other specified disorders of teeth and supporting structures: Secondary | ICD-10-CM | POA: Insufficient documentation

## 2017-04-09 DIAGNOSIS — F17210 Nicotine dependence, cigarettes, uncomplicated: Secondary | ICD-10-CM | POA: Insufficient documentation

## 2017-04-09 NOTE — Discharge Instructions (Signed)
Return if any problems.

## 2017-04-09 NOTE — ED Triage Notes (Signed)
Pt states he had his tooth pulled at the dentist yesterday and they didn't give him anything to take home for pain. Pt c/o severe pain and states he cant go to work and needs a doctors note.

## 2017-04-09 NOTE — ED Provider Notes (Signed)
[facility] Provider Note   CSN: [id number] Arrival date & time: 04/09/17  1246  By signing my name below, I, Majel Homer, attest that this documentation has been prepared under the direction and in the presence of non-physician practitioner, Ok Edwards, PA-C. Electronically Signed: Majel Homer, Scribe. 04/09/2017. Marland Kitchen  History   Chief Complaint Chief Complaint  Patient presents with  . Dental Pain   The history is provided by the patient. No language interpreter was used.   HPI Comments: Jackson Mcguire. is a 36 y.o. male who presents to the Emergency Department complaining of gradually worsening, dental pain s/p tooth extraction yesterday afternoon. Pt reports he was not given any pain medication after his procedure yesterday and is now experiencing severe pain. He states he cannot return to work due to his pain and is requesting a work note in the ED. He denies any other complaints.   Past Medical History:  Diagnosis Date  . Gout    There are no active problems to display for this patient.  History reviewed. No pertinent surgical history.  Home Medications    Prior to Admission medications   Medication Sig Start Date End Date Taking? Authorizing Provider  meloxicam (MOBIC) 15 MG tablet Take 1 tablet (15 mg total) by mouth daily. Take 1 daily with food. 09/27/16   Arthor Captain, PA-C  meloxicam (MOBIC) 15 MG tablet Take 1 tablet (15 mg total) by mouth daily. 09/27/16   Arthor Captain, PA-C  traMADol (ULTRAM) 50 MG tablet Take 1 tablet (50 mg total) by mouth 2 (two) times daily. 08/30/16   [name], PA-C    Family History No family history on file.  Social History Social History  Substance Use Topics  . Smoking status: Current Every Day Smoker    Packs/day: 1.00    Types: Cigarettes  . Smokeless tobacco: Never Used  . Alcohol use Yes     Comment: few drinks per night   Allergies   Patient has no known allergies.  Review of Systems Review of  Systems  Physical Exam Updated Vital Signs BP (!) 163/106   Pulse 99   Temp 98.8 F (37.1 C)   Resp 16   Ht  (1.803 m)   Wt 178 lb (80.7 kg)   SpO2 97%   BMI 24.83 kg/m   Physical Exam  Constitutional: He is oriented to person, place, and time. He appears well-developed and well-nourished.  HENT:  Head: Normocephalic.  Sutures to gumline in left lower gums.   Eyes: EOM are normal.  Neck: Normal range of motion.  Pulmonary/Chest: Effort normal.  Abdominal: He exhibits no distension.  Musculoskeletal: Normal range of motion.  Neurological: He is alert and oriented to person, place, and time.  Psychiatric: He has a normal mood and affect.  Nursing note and vitals reviewed.  ED Treatments / Results  DIAGNOSTIC STUDIES:  Oxygen Saturation is 97% on RA, normal by my interpretation.    COORDINATION OF CARE:  1:35 PM Discussed treatment plan with pt at bedside and pt agreed to plan.  Labs (all labs ordered are listed, but only abnormal results are displayed) Labs Reviewed - No data to display  EKG  EKG Interpretation None       Radiology No results found.  Procedures Procedures (including critical care time)  Medications Ordered in ED Medications - No data to display  Initial Impression / Assessment and Plan / ED Course  I have reviewed the triage vital  signs and the nursing notes.  Pertinent labs & imaging results that were available during my care of the patient were reviewed by me and considered in my medical decision making (see chart for details).      I personally performed the services in this documentation, which was scribed in my presence.  The recorded information has been reviewed and considered.   Barnet Pall.   Final Clinical Impressions(s) / ED Diagnoses   Final diagnoses:  Pain, dental  Status post tooth extraction    New Prescriptions Discharge Medication List as of 04/09/2017  1:39 PM    An After Visit Summary was printed  and given to the patient.  I personally performed the services in this documentation, which was scribed in my presence.  The recorded information has been reviewed and considered.   Barnet Pall.   Lonia Skinner Balm, PA-C 04/09/17 1527    Doug Sou, MD 04/09/17 [phone]

## 2017-04-09 NOTE — ED Notes (Signed)
Declined W/C at D/C and was escorted to lobby by RN. 

## 2017-05-21 ENCOUNTER — Encounter (HOSPITAL_COMMUNITY): Payer: Self-pay | Admitting: Emergency Medicine

## 2017-05-21 ENCOUNTER — Emergency Department (HOSPITAL_COMMUNITY)
Admission: EM | Admit: 2017-05-21 | Discharge: 2017-05-21 | Disposition: A | Discharge status: Home / Self Care | Payer: Self-pay | Attending: Emergency Medicine | Admitting: Emergency Medicine

## 2017-05-21 ENCOUNTER — Emergency Department (HOSPITAL_COMMUNITY): Payer: Self-pay

## 2017-05-21 DIAGNOSIS — Z79899 Other long term (current) drug therapy: Secondary | ICD-10-CM | POA: Insufficient documentation

## 2017-05-21 DIAGNOSIS — W108XXA Fall (on) (from) other stairs and steps, initial encounter: Secondary | ICD-10-CM | POA: Insufficient documentation

## 2017-05-21 DIAGNOSIS — Y999 Unspecified external cause status: Secondary | ICD-10-CM | POA: Insufficient documentation

## 2017-05-21 DIAGNOSIS — Y929 Unspecified place or not applicable: Secondary | ICD-10-CM | POA: Insufficient documentation

## 2017-05-21 DIAGNOSIS — S8392XA Sprain of unspecified site of left knee, initial encounter: Secondary | ICD-10-CM | POA: Insufficient documentation

## 2017-05-21 DIAGNOSIS — M25462 Effusion, left knee: Secondary | ICD-10-CM

## 2017-05-21 DIAGNOSIS — F17210 Nicotine dependence, cigarettes, uncomplicated: Secondary | ICD-10-CM | POA: Insufficient documentation

## 2017-05-21 DIAGNOSIS — Y9389 Activity, other specified: Secondary | ICD-10-CM | POA: Insufficient documentation

## 2017-05-21 MED ORDER — BUPIVACAINE HCL 0.25 % IJ SOLN
10.0000 mL | Freq: Once | INTRAMUSCULAR | Status: DC
  Filled 2017-05-21: qty 10

## 2017-05-21 MED ORDER — BUPIVACAINE HCL (PF) 0.25 % IJ SOLN
10.0000 mL | Freq: Once | INTRAMUSCULAR | Status: AC
  Administered 2017-05-21: 10 mL
  Filled 2017-05-21: qty 30

## 2017-05-21 NOTE — ED Provider Notes (Signed)
[facility] Provider Note   CSN: [id number] Arrival date & time: 05/21/17  1210   History   Chief Complaint Chief Complaint  Patient presents with  . Fall  . Knee Pain  . Head Injury    HPI Jackson Mcguire. is a 36 y.o. male who presents with L knee pain. He states that he hyperextended his knee while going down stairs and lost his balance and fell on to a flexed knee. He also hit his head but denies headache, LOC, dizziness. He reports severe pain with bending his knee and cannot bear weight. He has been using crutches from a previous injury. He states that he has had an injury to his right knee several years ago which required joint aspiration due to the significant swelling and is requesting this again since it helped.   HPI  Past Medical History:  Diagnosis Date  . Gout     There are no active problems to display for this patient.   History reviewed. No pertinent surgical history.   Home Medications    Prior to Admission medications   Medication Sig Start Date End Date Taking? Authorizing Provider  meloxicam (MOBIC) 15 MG tablet Take 1 tablet (15 mg total) by mouth daily. Take 1 daily with food. 09/27/16   Arthor Captain, PA-C  meloxicam (MOBIC) 15 MG tablet Take 1 tablet (15 mg total) by mouth daily. 09/27/16   Arthor Captain, PA-C  traMADol (ULTRAM) 50 MG tablet Take 1 tablet (50 mg total) by mouth 2 (two) times daily. 08/30/16   [name], PA-C    Family History No family history on file.  Social History Social History  Substance Use Topics  . Smoking status: Current Every Day Smoker    Packs/day: 1.00    Types: Cigarettes  . Smokeless tobacco: Never Used  . Alcohol use Yes     Comment: few drinks per night     Allergies   Patient has no known allergies.   Review of Systems Review of Systems  Musculoskeletal: Positive for arthralgias, gait problem and joint swelling.  Skin: Negative for wound.  Neurological: Negative for weakness and  numbness.     Physical Exam Updated Vital Signs BP (!) 140/91 (BP Location: Left Arm)   Pulse 93   Temp 97.9 F (36.6 C)   Resp 16   Ht 5\' 11"  (1.803 m)   Wt 80.7 kg (178 lb)   SpO2 97%   BMI 24.83 kg/m   Physical Exam  Constitutional: He is oriented to person, place, and time. He appears well-developed and well-nourished. No distress.  HENT:  Head: Normocephalic and atraumatic.  Eyes: Conjunctivae are normal. Pupils are equal, round, and reactive to light. Right eye exhibits no discharge. Left eye exhibits no discharge. No scleral icterus.  Neck: Normal range of motion.  Cardiovascular: Normal rate.   Pulmonary/Chest: Effort normal. No respiratory distress.  Abdominal: He exhibits no distension.  Musculoskeletal:  Left knee: Large suprapatellar effusion with tenderness to palpation. Decreased ROM due to pain and swelling. N/V intact.   Neurological: He is alert and oriented to person, place, and time.  Skin: Skin is warm and dry.  Psychiatric: He has a normal mood and affect. His behavior is normal.  Nursing note and vitals reviewed.    ED Treatments / Results  Labs (all labs ordered are listed, but only abnormal results are displayed) Labs Reviewed - No data to display  EKG  EKG Interpretation None  Radiology Dg Knee Complete 4 Views Left  Result Date: 05/21/2017 CLINICAL DATA:  Left knee pain and swelling due to a fall down stairs last night. Initial encounter. EXAM: LEFT KNEE - COMPLETE 4+ VIEW COMPARISON:  None. FINDINGS: Moderate joint effusion is identified. There is no fracture. Fragmentation of the tibial tuberosity is consistent with old Osgood-Schlatter disease. IMPRESSION: Moderate joint effusion without fracture. 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 Electronically Signed   By: Drusilla Kannerhomas  Dalessio M.D.   On: 05/21/2017 12:56    Procedures .Joint Aspiration/Arthrocentesis Date/Time: 05/21/2017 4:56 PM Performed by: Bethel BornGEKAS, Leighton Luster MARIE Authorized by: Terance HartGEKAS,  Maggi Hershkowitz MARIE   Consent:    Consent obtained:  Verbal   Consent given by:  Patient   Risks discussed:  Bleeding, infection and incomplete drainage   Alternatives discussed:  Alternative treatment Location:    Location:  Knee   Knee:  L knee Anesthesia (see MAR for exact dosages):    Anesthesia method:  Local infiltration   Local anesthetic:  Bupivacaine 0.25% w/o epi Procedure details:    Preparation: Patient was prepped and draped in usual sterile fashion     Needle gauge:  18 G   Ultrasound guidance: no     Approach:  Superior   Aspirate amount:  85cc   Aspirate characteristics:  Bloody   Steroid injected: no     Specimen collected: no   Post-procedure details:    Dressing:  Sterile dressing   Patient tolerance of procedure:  Tolerated well, no immediate complications Comments:     Moderate improvement in ROM   (including critical care time)    Medications Ordered in ED Medications  bupivacaine (PF) (MARCAINE) 0.25 % injection 10 mL (10 mLs Infiltration Given 05/21/17 1502)     Initial Impression / Assessment and Plan / ED Course  I have reviewed the triage vital signs and the nursing notes.  Pertinent labs & imaging results that were available during my care of the patient were reviewed by me and considered in my medical decision making (see chart for details).  36 year old male with knee effusion due to knee sprain. Xray negative for fx. Pt is requesting joint aspiration since it significantly helped him in the past. I discussed that this is not routinely done but discussed with Dr. Ethelda ChickJacubowitz who advised to proceed with aspiration. Joint aspiration yielded 85cc of bloody fluid. Swelling was significantly decreased and he had improved ROM however was still unable to bear weight. Discussed that he may have a soft tissue injury and recommend following up with ortho if symptoms don't improve. Knee immobilizer was given. RICE protocol discussed. Return precautions  given.  Final Clinical Impressions(s) / ED Diagnoses   Final diagnoses:  Effusion of left knee  Sprain of left knee, unspecified ligament, initial encounter    New Prescriptions Discharge Medication List as of 05/21/2017  4:13 PM       Bethel BornGekas, Madysen Faircloth Marie, PA-C 05/21/17 1703    Doug SouJacubowitz, Sam, MD 05/21/17 1710

## 2017-05-21 NOTE — ED Triage Notes (Signed)
Pt reports fell down about 4 steps last night causing injury to head and left knee. Pt denies + LOC. Pt presents ambulating with crutches from home.

## 2017-05-21 NOTE — Discharge Instructions (Signed)
Rest - please stay off left leg as much as possible until you can put weight on it without severe pain Ice - ice for 20 minutes at a time, several times a day Compression - wear brace to provide support Elevate - elevate right leg above level of heart Ibuprofen - take with food. Take up to 3-4 times daily Follow up with Ortho

## 2017-11-07 IMAGING — DX DG KNEE COMPLETE 4+V*L*
4 series · 4 of 4 positions shown · non-contrast
Comparison: None.

CLINICAL DATA: Left knee pain and swelling due to a fall down
stairs last night. Initial encounter.

EXAM:
LEFT KNEE - COMPLETE 4+ VIEW

[knee ap]
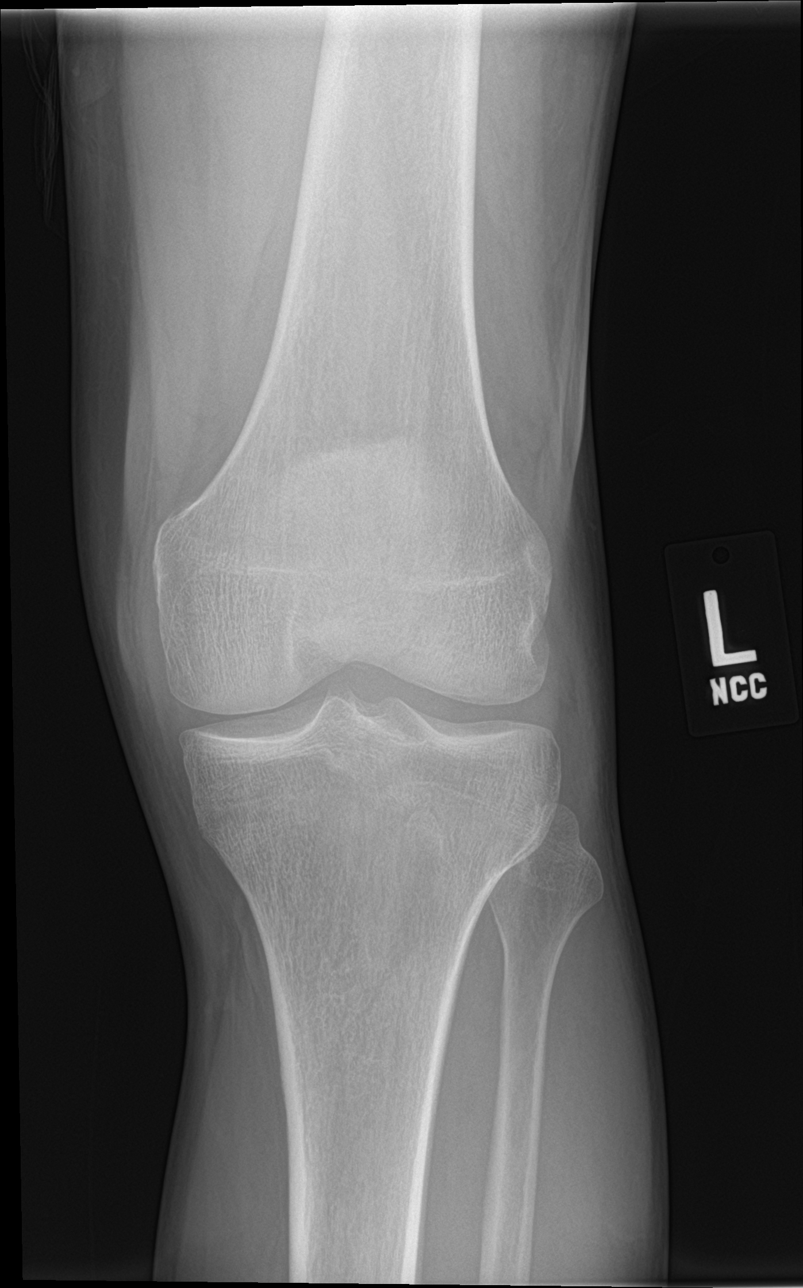

[knee lat]
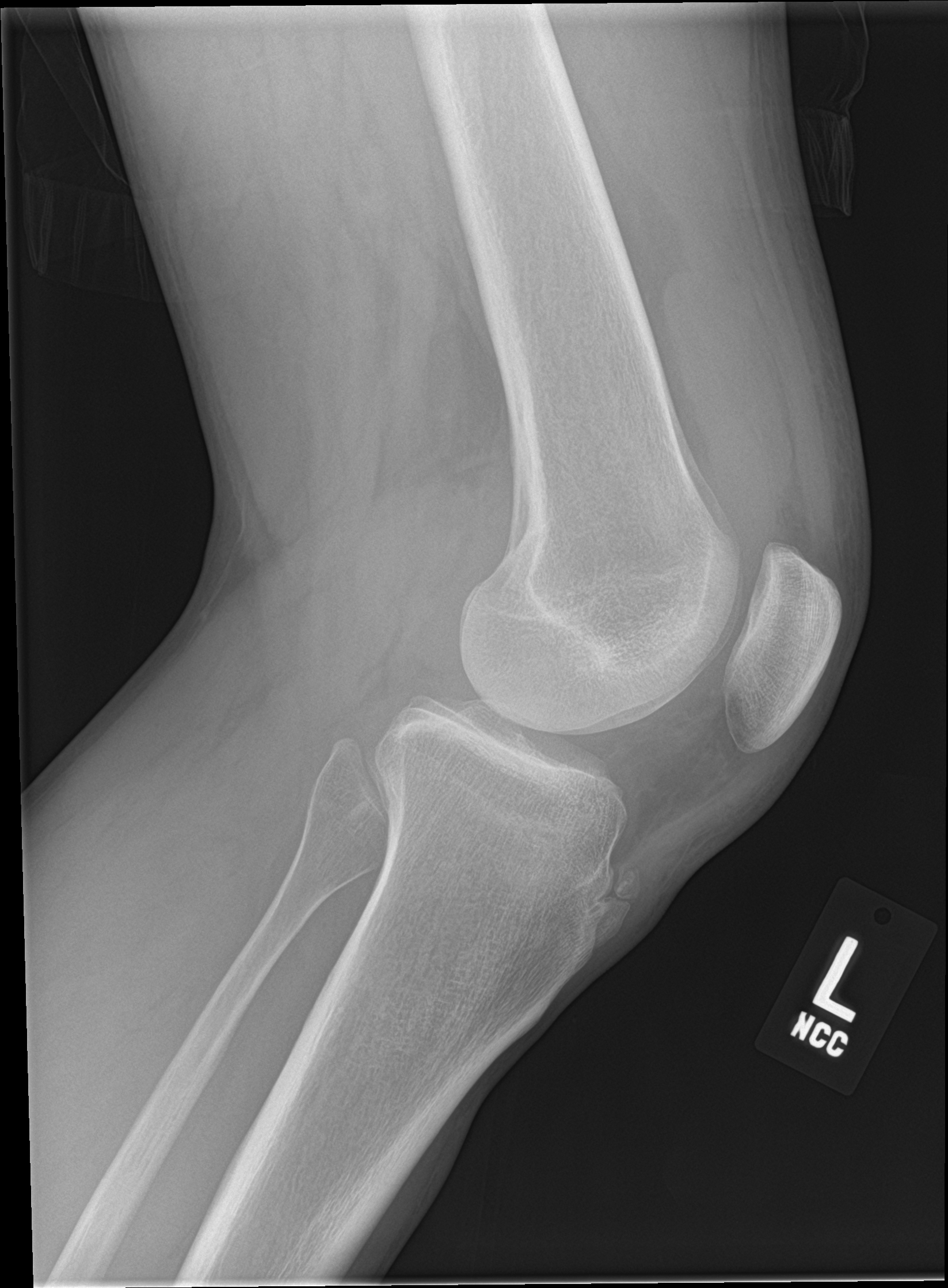

[knee obl (1 of 2)]
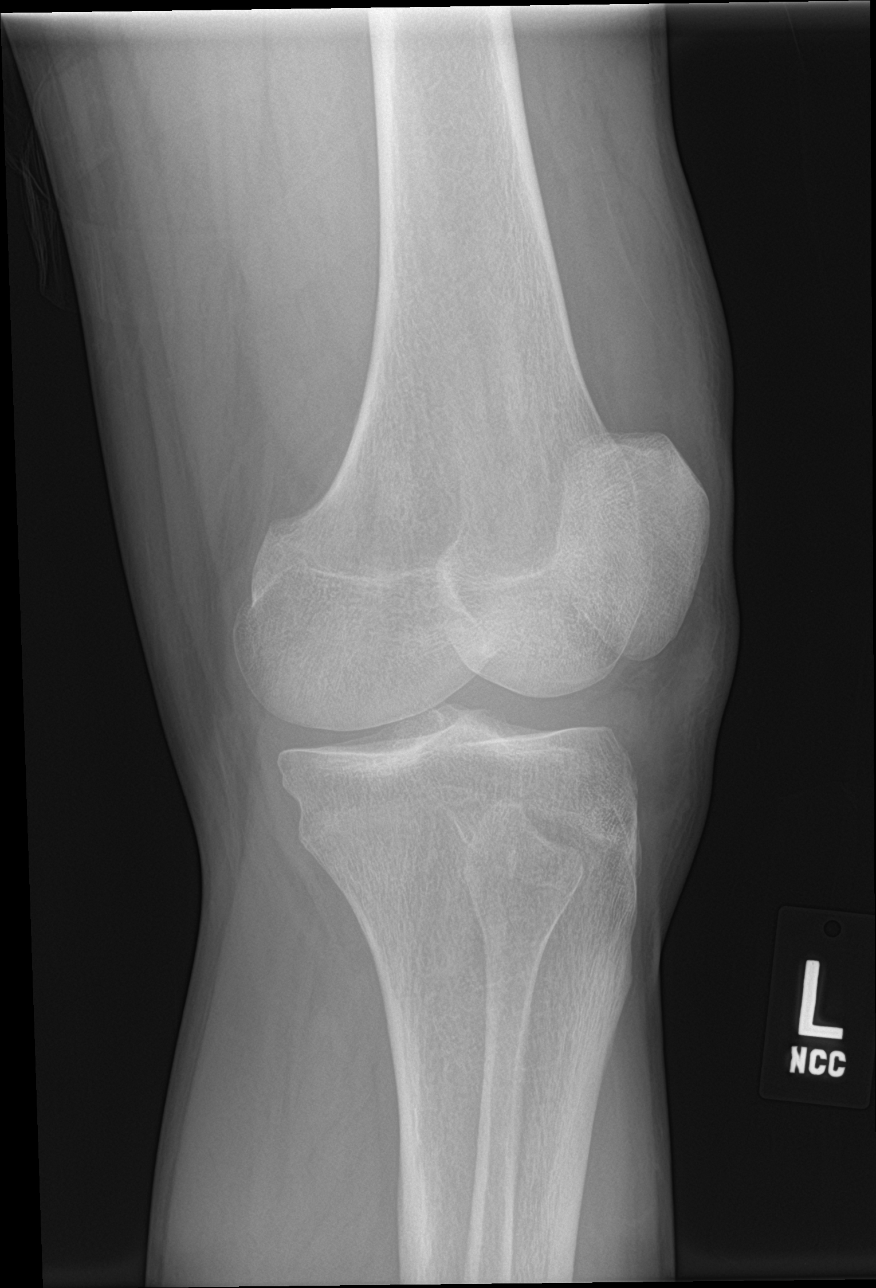

[knee obl (2 of 2)]
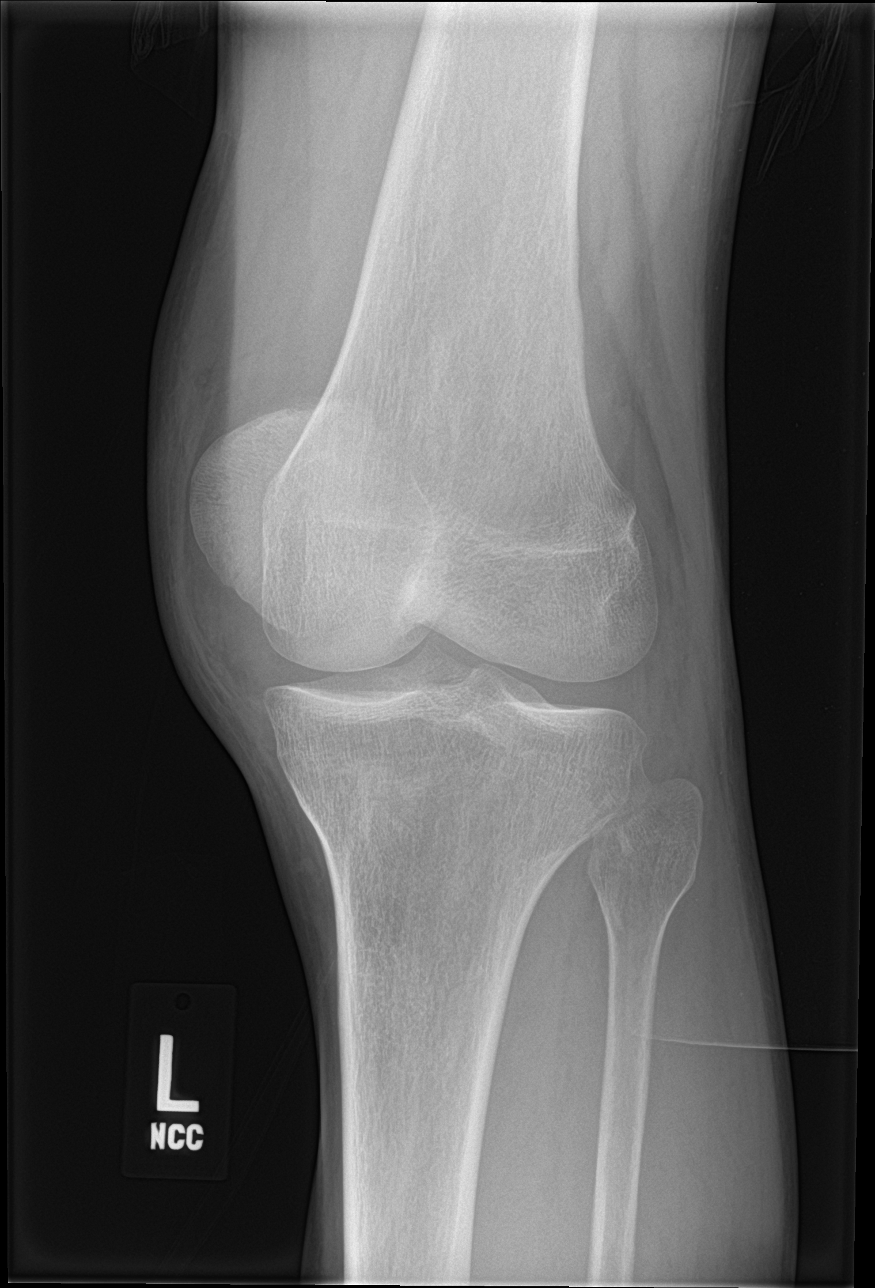

[4 of 4 positions shown; findings below may reference images not displayed]

FINDINGS: Moderate joint effusion is identified. There is no fracture.
Fragmentation of the tibial tuberosity is consistent with old
Osgood-Schlatter disease.
IMPRESSION: Moderate joint effusion without fracture.

Old Osgood-Schlatter disease.

## 2021-01-16 DIAGNOSIS — M1380 Other specified arthritis, unspecified site: Secondary | ICD-10-CM | POA: Insufficient documentation

## 2021-01-16 DIAGNOSIS — I10 Essential (primary) hypertension: Secondary | ICD-10-CM | POA: Insufficient documentation

## 2021-01-16 DIAGNOSIS — M1990 Unspecified osteoarthritis, unspecified site: Secondary | ICD-10-CM | POA: Insufficient documentation

## 2021-06-21 DIAGNOSIS — N179 Acute kidney failure, unspecified: Secondary | ICD-10-CM | POA: Insufficient documentation

## 2021-06-21 DIAGNOSIS — M109 Gout, unspecified: Secondary | ICD-10-CM | POA: Insufficient documentation

## 2021-06-22 DIAGNOSIS — R7401 Elevation of levels of liver transaminase levels: Secondary | ICD-10-CM | POA: Insufficient documentation

## 2022-04-30 ENCOUNTER — Other Ambulatory Visit: Payer: Self-pay

## 2022-04-30 ENCOUNTER — Ambulatory Visit
Admission: EM | Admit: 2022-04-30 | Discharge: 2022-04-30 | Disposition: A | Discharge status: Home / Self Care | Payer: Self-pay

## 2022-04-30 DIAGNOSIS — M109 Gout, unspecified: Secondary | ICD-10-CM

## 2022-04-30 MED ORDER — PREDNISONE 10 MG (21) PO TBPK: ORAL_TABLET | ORAL | 0 refills | Status: DC

## 2022-04-30 NOTE — Discharge Instructions (Addendum)
Take the Prednisone as directed.  Follow the low purine diet guide to help prevent more flares.  Increase your oral water intake to help flush your system and flush the purines.   Resume Amlodipine 10 mg in the morning and the Lisinopril 40 mg in the morning and and 20 mg ( half tab) in the evening.  If you have any chest pain, headache, vision changes, slurred speech, or dizziness you need to call 911 and go to the ER.

## 2022-04-30 NOTE — ED Provider Notes (Signed)
[facility]    CSN: [id number] Arrival date & time: 04/30/22  1139      History   Chief Complaint Chief Complaint  Patient presents with   Knee Pain    HPI Jackson Mcguire. is a 41 y.o. male.   HPI  41 year old male here for evaluation of left knee pain.  Patient reports that he has been experiencing a gout flare in his left knee for the past 2 days.  He states that it began around 330 the morning Wednesday and is not responding to colchicine.  He has been on allopurinol in the past but that did not prevent gout flares so he is no longer taking it.  Patient is currently starting a new job and he does not have health insurance.  He states that he typically has to have prednisone when his gout flares get to this stage.  He also has a history of high blood pressure for which she was prescribed amlodipine and lisinopril but he has not been taking secondary to the expense.  He states that he does use [facility] and he has prescription refills available on both of those medications.  His blood pressure is markedly elevated at 164/112 here in clinic but he is denying any chest pain, shortness of breath, dizziness, or headache.  He also denies any fever or injury secondary to the left knee pain and swelling.  He does report that his knee is red and hot to touch and is following his typical pattern of gout flare.  He reports that he is a social drinker and he is unsure of what is his trigger for his gout flare.  Past Medical History:  Diagnosis Date   Gout     Patient Active Problem List   Diagnosis Date Noted   Transaminitis 06/22/2021   Gout 06/21/2021   AKI (acute kidney injury) (HCC) 06/21/2021   Primary hypertension 01/16/2021   Inflammatory arthritis 01/16/2021    History reviewed. No pertinent surgical history.     Home Medications    Prior to Admission medications   Medication Sig Start Date End Date Taking? Authorizing Provider  acetaminophen (TYLENOL) 325 MG  tablet Take by mouth. 09/29/17  Yes [provider]  colchicine 0.6 MG tablet Take 0.6 mg by mouth daily. 03/01/22  Yes [provider]  omeprazole (PRILOSEC) 20 MG capsule Take by mouth. 09/29/17  Yes [provider]  predniSONE (STERAPRED UNI-PAK 21 TAB) 10 MG (21) TBPK tablet Take 6 tablets on day 1, 5 tablets day 2, 4 tablets day 3, 3 tablets day 4, 2 tablets day 5, 1 tablet day 6 04/30/22  Yes [name], NP  meloxicam (MOBIC) 15 MG tablet Take 1 tablet (15 mg total) by mouth daily. Take 1 daily with food. 09/27/16   Arthor Captain, PA-C  meloxicam (MOBIC) 15 MG tablet Take 1 tablet (15 mg total) by mouth daily. 09/27/16   Arthor Captain, PA-C  traMADol (ULTRAM) 50 MG tablet Take 1 tablet (50 mg total) by mouth 2 (two) times daily. 08/30/16   [name], PA-C    Family History History reviewed. No pertinent family history.  Social History Social History   Tobacco Use   Smoking status: Some Days    Packs/day: 1.00    Types: Cigarettes   Smokeless tobacco: Never  Vaping Use   Vaping Use: Never used  Substance Use Topics   Alcohol use: Yes    Comment: few drinks per night  Drug use: No     Allergies   Patient has no known allergies.   Review of Systems Review of Systems  Constitutional:  Negative for fever.  Musculoskeletal:  Positive for arthralgias and joint swelling.  Skin:  Positive for color change.  Neurological:  Negative for weakness and numbness.  Hematological: Negative.   Psychiatric/Behavioral: Negative.      Physical Exam Triage Vital Signs ED Triage Vitals  Enc Vitals Group     BP 04/30/22 1155 (!) 157/115     Pulse Rate 04/30/22 1155 (!) 116     Resp 04/30/22 1155 18     Temp 04/30/22 1155 98.5 F (36.9 C)     Temp Source 04/30/22 1155 Oral     SpO2 04/30/22 1155 100 %     Weight 04/30/22 1152 185 lb (83.9 kg)     Height 04/30/22 1152 5\' 10"  (1.778 m)     Head Circumference --      Peak Flow --      Pain  Score --      Pain Loc --      Pain Edu? --      Excl. in GC? --    No data found.  Updated Vital Signs BP (!) 164/112 (BP Location: Right Arm)   Pulse (!) 116   Temp 98.5 F (36.9 C) (Oral)   Resp 18   Ht 5\' 10"  (1.778 m)   Wt 185 lb (83.9 kg)   SpO2 100%   BMI 26.54 kg/m   Visual Acuity Right Eye Distance:   Left Eye Distance:   Bilateral Distance:    Right Eye Near:   Left Eye Near:    Bilateral Near:     Physical Exam Vitals and nursing note reviewed.  Constitutional:      Appearance: Normal appearance. He is not ill-appearing.  HENT:     Head: Normocephalic and atraumatic.  Musculoskeletal:        General: Swelling and tenderness present. No deformity or signs of injury. Normal range of motion.  Skin:    General: Skin is warm and dry.     Capillary Refill: Capillary refill takes less than 2 seconds.     Findings: Erythema present. No bruising.  Neurological:     General: No focal deficit present.     Mental Status: He is alert and oriented to person, place, and time.  Psychiatric:        Mood and Affect: Mood normal.        Behavior: Behavior normal.        Thought Content: Thought content normal.        Judgment: Judgment normal.     UC Treatments / Results  Labs (all labs ordered are listed, but only abnormal results are displayed) Labs Reviewed - No data to display  EKG   Radiology No results found.  Procedures Procedures (including critical care time)  Medications Ordered in UC Medications - No data to display  Initial Impression / Assessment and Plan / UC Course  I have reviewed the triage vital signs and the nursing notes.  Pertinent labs & imaging results that were available during my care of the patient were reviewed by me and considered in my medical decision making (see chart for details).  Patient is a nontoxic-appearing 31-year-old male here for evaluation of what he thinks is a gout flare in his left knee that has been going on  for the past 2 days as outlined in  HPI above.  On exam patient does have swelling over the patellar tendon inferior to the patella of the left knee.  The area is hot and red as well.  There is no tenderness with palpation of the patella, medial lateral joint line, or posterior knee.  There is tenderness with palpation of the patellar tendon over the area of erythema and edema.  Patient states that this is consistent with his previous gout flares.  DP and PT pulses in the left foot are 2+.  I will treat the patient for gout flare with prednisone pack.  I have advised him that the prednisone will cause his blood pressure to go up.  He states he does have open refills on his blood pressure medication and [facility].  He was on amlodipine 10 mg daily with lisinopril 40 mg in the morning and 20 mg in the evening.  I have advised him that if he develops any chest pain, dizziness, headache, shortness of breath, visual changes, or slurred speech that he needs to call 911 or go to the ER.  Patient verbalizes understanding of same.  He denies any and for work note.   Final Clinical Impressions(s) / UC Diagnoses   Final diagnoses:  Acute gout of left knee, unspecified cause     Discharge Instructions      Take the Prednisone as directed.  Follow the low purine diet guide to help prevent more flares.  Increase your oral water intake to help flush your system and flush the purines.   Resume Amlodipine 10 mg in the morning and the Lisinopril 40 mg in the morning and and 20 mg ( half tab) in the evening.  If you have any chest pain, headache, vision changes, slurred speech, or dizziness you need to call 911 and go to the ER.     ED Prescriptions     Medication Sig Dispense Auth. Provider   predniSONE (STERAPRED UNI-PAK 21 TAB) 10 MG (21) TBPK tablet Take 6 tablets on day 1, 5 tablets day 2, 4 tablets day 3, 3 tablets day 4, 2 tablets day 5, 1  tablet day 6 21 tablet Becky Augustayan, Caysie Minnifield, NP      PDMP not reviewed this encounter.   Becky Augustayan, Fatima Fedie, NP 04/30/22 1234

## 2022-04-30 NOTE — ED Triage Notes (Signed)
Patient is here with "left knee pain". Chronic gout for 14 yrs. Usually on medication to use PRN, Not working this time, due to flareup up in sleep Wednesday morning around 330am. No injury.

## 2022-08-12 ENCOUNTER — Ambulatory Visit
Admission: EM | Admit: 2022-08-12 | Discharge: 2022-08-12 | Disposition: A | Discharge status: Home / Self Care | Payer: Self-pay | Attending: Family Medicine | Admitting: Family Medicine

## 2022-08-12 DIAGNOSIS — M1A0710 Idiopathic chronic gout, right ankle and foot, without tophus (tophi): Secondary | ICD-10-CM

## 2022-08-12 MED ORDER — IBUPROFEN 800 MG PO TABS: 800.0000 mg | ORAL_TABLET | Freq: Three times a day (TID) | ORAL | 0 refills | Status: AC

## 2022-08-12 MED ORDER — AMLODIPINE BESYLATE 10 MG PO TABS: 10.0000 mg | ORAL_TABLET | Freq: Every day | ORAL | 0 refills | Status: DC

## 2022-08-12 MED ORDER — KETOROLAC TROMETHAMINE 60 MG/2ML IM SOLN
30.0000 mg | Freq: Once | INTRAMUSCULAR | Status: AC
  Administered 2022-08-12: 30 mg via INTRAMUSCULAR

## 2022-08-12 MED ORDER — LISINOPRIL 40 MG PO TABS: 40.0000 mg | ORAL_TABLET | Freq: Every day | ORAL | 0 refills | Status: DC

## 2022-08-12 NOTE — Discharge Instructions (Addendum)
A prescription for colchicine was sent to your pharmacy.  Take 2 tablets at first and then 1 hour later take 1 tablet.  Tomorrow, start taking 1 tablet daily for the next week.  After 5 PM you can start taking the ibuprofen.  I would speak with your rheumatologist or your primary care doctor about restarting allopurinol.  Your blood pressure is uncontrolled and it is unsafe to give you prednisone at this time.  I sent 2 medications to your pharmacy one called amlodipine and the other called lisinopril.  You have taking these medications previously.  It is important that you get a primary care doctor.  If your symptoms are not better in a few days and your blood pressure is down, we can consider prednisone at that time.

## 2022-08-12 NOTE — ED Provider Notes (Signed)
[facility]    CSN: [id number] Arrival date & time: 08/12/22  1035      History   Chief Complaint Chief Complaint  Patient presents with   Knee Pain    HPI  HPI Jackson Mcguire. is a 41 y.o. male.   [name] presents for right ankle pain for the past 3 days.  He has some redness and swelling to his right ankle.  He ran out of his colchicine 3 days ago.  He is not taking allopurinol.  States that steroids usually help him.  Does not endorse extremity weakness or injury.  He drinks " a few" alcoholic beverages every night.  He has been eating red meat and seafood.  He has high blood pressure but is not taking any of his medications.  Stated last year they told him that his kidneys were hurt and he did not taking it since.  States that he has prescriptions at the pharmacy but he is does not know what to take.  Denies chest pain, palpitations, exertional dyspnea, lightheadedness, headaches and vision changes.  Home BP Monitoring: No    Fever : no  Sore throat: no   Cough: no Appetite: normal  Hydration: normal  Abdominal pain: no Nausea: no Vomiting: no Sleep disturbance: yes, has "lots of anxiety"  Back Pain: no Headache: no     Past Medical History:  Diagnosis Date   Gout     Patient Active Problem List   Diagnosis Date Noted   Transaminitis 06/22/2021   Gout 06/21/2021   AKI (acute kidney injury) (HCC) 06/21/2021   Primary hypertension 01/16/2021   Inflammatory arthritis 01/16/2021    History reviewed. No pertinent surgical history.     Home Medications    Prior to Admission medications   Medication Sig Start Date End Date Taking? Authorizing Provider  amLODipine (NORVASC) 10 MG tablet Take 1 tablet (10 mg total) by mouth daily. 08/12/22 09/11/22 Yes Nicoya Friel, DO  colchicine 0.6 MG tablet Take 0.6 mg by mouth daily. 03/01/22  Yes [provider]  ibuprofen (ADVIL) 800 MG tablet Take 1 tablet (800 mg total) by mouth 3 (three) times  daily. 08/12/22  Yes Elia Nunley, DO  lisinopril (ZESTRIL) 40 MG tablet Take 1 tablet (40 mg total) by mouth daily. 08/12/22  Yes Yarissa Reining, Seward Meth, DO  acetaminophen (TYLENOL) 325 MG tablet Take by mouth. 09/29/17   [provider]  omeprazole (PRILOSEC) 20 MG capsule Take by mouth. 09/29/17   [provider]  predniSONE (STERAPRED UNI-PAK 21 TAB) 10 MG (21) TBPK tablet Take 6 tablets on day 1, 5 tablets day 2, 4 tablets day 3, 3 tablets day 4, 2 tablets day 5, 1 tablet day 6 04/30/22   [name], NP  traMADol (ULTRAM) 50 MG tablet Take 1 tablet (50 mg total) by mouth 2 (two) times daily. 08/30/16   [name], PA-C    Family History History reviewed. No pertinent family history.  Social History Social History   Tobacco Use   Smoking status: Some Days    Packs/day: 1.00    Types: Cigarettes   Smokeless tobacco: Never  Vaping Use   Vaping Use: Never used  Substance Use Topics   Alcohol use: Yes    Comment: few drinks per night   Drug use: No     Allergies   Patient has no known allergies.   Review of Systems Review of Systems: :negative unless otherwise stated in HPI.  Physical Exam Triage Vital Signs ED Triage Vitals  Enc Vitals Group     BP      Pulse      Resp      Temp      Temp src      SpO2      Weight      Height      Head Circumference      Peak Flow      Pain Score      Pain Loc      Pain Edu?      Excl. in GC?    No data found.  Updated Vital Signs BP (!) 155/115   Pulse 97   Ht 5\' 10"  (1.778 m)   Wt 86.2 kg   SpO2 97%   BMI 27.26 kg/m   Visual Acuity Right Eye Distance:   Left Eye Distance:   Bilateral Distance:    Right Eye Near:   Left Eye Near:    Bilateral Near:     Physical Exam GEN: well appearing male in no acute distress  CVS: well perfused, regular rate and rhythm, no JVP RESP: speaking in full sentences without pause, no respiratory distress, clear to auscultation bilaterally MSK: No left  ankle swelling.  No pitting edema in his shins.   Right ankle: Inspection: +erythema, 2+ edema, no ecchymosis or bony deformity, no bone pes planus or cavus deformity, transverse and medial arches intact Palpation: Tenderness near the lateral malleolus  ROM: Limited passive range of motion due to pain, full active ROM Strength: 5/5 in all directions No ligamentous laxity No pain at the base of the fifth metatarsal Able to ambulate  with pain  Neurovascularly intact, no instability noted Skin: Warm and dry   UC Treatments / Results  Labs (all labs ordered are listed, but only abnormal results are displayed) Labs Reviewed - No data to display  EKG   Radiology No results found.  Procedures Procedures (including critical care time)  Medications Ordered in UC Medications  ketorolac (TORADOL) injection 30 mg (30 mg Intramuscular Given 08/12/22 1132)    Initial Impression / Assessment and Plan / UC Course  I have reviewed the triage vital signs and the nursing notes.  Pertinent labs & imaging results that were available during my care of the patient were reviewed by me and considered in my medical decision making (see chart for details).      Pt is a 41 y.o.  male with history of chronic gout presents for acute right ankle pain after his colchicine prescription ran out.   Exam is concerning for acute gout of right ankle. Imaging deferred.  Given Toradol injection here.  Patient to gradually return to normal activities, as tolerated and continue ordinary activities within the limits permitted by pain. He requests prednisone however due to his elevated blood pressure this is not safe.  Prescribed colchicine and advised to take ibuprofen 800 mg TID PRN for additional relief.  Patient to follow-up with his rheumatologist.  Reviewed most recent, February 2022, rheumatology note.  He was supposed to start allopurinol at that time.    His blood pressure is significantly elevated here.  His  cardiopulmonary exam is unremarkable.  He is not adherent to his medications.  Restart amlodipine and lisinopril; Rx sent to pharmacy.  Avoid HCTZ due to history of chronic gout.  Asked patient to speak with his doctor about taking allopurinol.  Advised patient that colchicine and ibuprofen should help with his  gout however if his blood pressure becomes under control in a few days we may consider steroids at that time.   Return and ED precautions given.  Discussed MDM, treatment plan and plan for follow-up with patient/parent who agrees with plan.   Final Clinical Impressions(s) / UC Diagnoses   Final diagnoses:  Chronic gout of right ankle, unspecified cause     Discharge Instructions      A prescription for colchicine was sent to your pharmacy.  Take 2 tablets at first and then 1 hour later take 1 tablet.  Tomorrow, start taking 1 tablet daily for the next week.  After 5 PM you can start taking the ibuprofen.  I would speak with your rheumatologist or your primary care doctor about restarting allopurinol.  Your blood pressure is uncontrolled and it is unsafe to give you prednisone at this time.  I sent 2 medications to your pharmacy one called amlodipine and the other called lisinopril.  You have taking these medications previously.  It is important that you get a primary care doctor.  If your symptoms are not better in a few days and your blood pressure is down, we can consider prednisone at that time.      ED Prescriptions     Medication Sig Dispense Auth. Provider   amLODipine (NORVASC) 10 MG tablet Take 1 tablet (10 mg total) by mouth daily. 30 tablet Jaryn Hocutt, DO   lisinopril (ZESTRIL) 40 MG tablet Take 1 tablet (40 mg total) by mouth daily. 30 tablet Emara Lichter, DO   ibuprofen (ADVIL) 800 MG tablet Take 1 tablet (800 mg total) by mouth 3 (three) times daily. 21 tablet [name], DO      PDMP not reviewed this encounter.   [name], DO 08/12/22  1250

## 2022-08-12 NOTE — ED Triage Notes (Signed)
Pt c/o gout flare up RT knee x3 days, pt states he normally takes colchicine but ran out of rx

## 2023-04-23 ENCOUNTER — Ambulatory Visit
Admission: EM | Admit: 2023-04-23 | Discharge: 2023-04-23 | Disposition: A | Discharge status: Home / Self Care | Payer: Self-pay

## 2023-04-23 DIAGNOSIS — M109 Gout, unspecified: Secondary | ICD-10-CM

## 2023-04-23 MED ORDER — ATENOLOL 25 MG PO TABS: 12.5000 mg | ORAL_TABLET | Freq: Every day | ORAL | 2 refills | Status: DC

## 2023-04-23 MED ORDER — AMLODIPINE BESYLATE 10 MG PO TABS: 10.0000 mg | ORAL_TABLET | Freq: Every day | ORAL | 2 refills | Status: DC

## 2023-04-23 MED ORDER — LISINOPRIL 40 MG PO TABS: 40.0000 mg | ORAL_TABLET | Freq: Every day | ORAL | 2 refills | Status: DC

## 2023-04-23 MED ORDER — PREDNISONE 10 MG PO TABS: ORAL_TABLET | ORAL | 0 refills | Status: AC

## 2023-04-23 MED ORDER — COLCHICINE 0.6 MG PO TABS: ORAL_TABLET | ORAL | 0 refills | Status: DC

## 2023-04-23 MED ORDER — ALLOPURINOL 100 MG PO TABS: 100.0000 mg | ORAL_TABLET | Freq: Every day | ORAL | 2 refills | Status: DC

## 2023-04-23 NOTE — Discharge Instructions (Addendum)
Do not start allopurinol until your gout flare is resolved following completion of prednisone.  Use colchicine as directed for future flares.  Follow-up with your primary care provider for refills and any medication adjustments especially for hypertension.

## 2023-04-23 NOTE — ED Provider Notes (Signed)
Jackson Mcguire    CSN: [id number] Arrival date & time: 04/23/23  4098      History   Chief Complaint Chief Complaint  Patient presents with   Wrist Pain    HPI Jackson Mcguire. is a 42 y.o. male.    Wrist Pain    Presents to urgent care with complaint of left wrist pain x 3 days.  He denies any injuries or known precipitating events.  Endorses history of gout.  Patient has been incarcerated and has not had any medication refills.  He does not have a primary care provider.  Past Medical History:  Diagnosis Date   Gout     Patient Active Problem List   Diagnosis Date Noted   Transaminitis 06/22/2021   Gout 06/21/2021   AKI (acute kidney injury) (HCC) 06/21/2021   Primary hypertension 01/16/2021   Inflammatory arthritis 01/16/2021    History reviewed. No pertinent surgical history.     Home Medications    Prior to Admission medications   Medication Sig Start Date End Date Taking? Authorizing Provider  allopurinol (ZYLOPRIM) 100 MG tablet Take 200 mg by mouth 2 (two) times daily.   Yes [provider]  atenolol (TENORMIN) 25 MG tablet Take 10 mg by mouth daily.   Yes [provider]  acetaminophen (TYLENOL) 325 MG tablet Take by mouth. 09/29/17   [provider]  amLODipine (NORVASC) 10 MG tablet Take 1 tablet (10 mg total) by mouth daily. 08/12/22 09/11/22  [name], DO  colchicine 0.6 MG tablet Take 0.6 mg by mouth daily. 03/01/22   [provider]  ibuprofen (ADVIL) 800 MG tablet Take 1 tablet (800 mg total) by mouth 3 (three) times daily. 08/12/22   Brimage, Seward Meth, DO  lisinopril (ZESTRIL) 40 MG tablet Take 1 tablet (40 mg total) by mouth daily. 08/12/22   [name], DO  omeprazole (PRILOSEC) 20 MG capsule Take by mouth. 09/29/17   [provider]  predniSONE (STERAPRED UNI-PAK 21 TAB) 10 MG (21) TBPK tablet Take 6 tablets on day 1, 5 tablets day 2, 4 tablets day 3, 3 tablets day 4, 2 tablets day  5, 1 tablet day 6 04/30/22   [name], NP  traMADol (ULTRAM) 50 MG tablet Take 1 tablet (50 mg total) by mouth 2 (two) times daily. 08/30/16   [name], PA-C    Family History History reviewed. No pertinent family history.  Social History Social History   Tobacco Use   Smoking status: Some Days    Packs/day: 1    Types: Cigarettes   Smokeless tobacco: Never  Vaping Use   Vaping Use: Never used  Substance Use Topics   Alcohol use: Yes    Comment: few drinks per night   Drug use: No     Allergies   Patient has no known allergies.   Review of Systems Review of Systems   Physical Exam Triage Vital Signs ED Triage Vitals  Enc Vitals Group     BP 04/23/23 1015 (!) 170/131     Pulse Rate 04/23/23 1015 (!) 113     Resp 04/23/23 1015 18     Temp 04/23/23 1015 99.1 F (37.3 C)     Temp Source 04/23/23 1015 Oral     SpO2 04/23/23 1015 99 %     Weight --      Height --      Head Circumference --      Peak Flow --  Pain Score 04/23/23 1012 9     Pain Loc --      Pain Edu? --      Excl. in GC? --    No data found.  Updated Vital Signs BP (!) 164/123 (BP Location: Left Arm)   Pulse (!) 113   Temp 99.1 F (37.3 C) (Oral)   Resp 18   SpO2 99%   Visual Acuity Right Eye Distance:   Left Eye Distance:   Bilateral Distance:    Right Eye Near:   Left Eye Near:    Bilateral Near:     Physical Exam Vitals reviewed.  Constitutional:      Appearance: Normal appearance.  Musculoskeletal:     Left wrist: Swelling and tenderness present.     Left hand: Swelling and tenderness present.  Skin:    General: Skin is warm and dry.  Neurological:     General: No focal deficit present.     Mental Status: He is alert and oriented to person, place, and time.  Psychiatric:        Mood and Affect: Mood normal.        Behavior: Behavior normal.      UC Treatments / Results  Labs (all labs ordered are listed, but only abnormal results are displayed) Labs  Reviewed - No data to display  EKG   Radiology No results found.  Procedures Procedures (including critical care time)  Medications Ordered in UC Medications - No data to display  Initial Impression / Assessment and Plan / UC Course  I have reviewed the triage vital signs and the nursing notes.  Pertinent labs & imaging results that were available during my care of the patient were reviewed by me and considered in my medical decision making (see chart for details).   Jackson Mcguire. is a 42 y.o. male presenting with L wrist and hand swelling. Patient is afebrile without recent antipyretics, satting well on room air. Overall is well appearing and non-toxic, well hydrated, without respiratory distress. Pulmonary exam is unremarkable.  Lungs CTAB without wheezing, rhonchi, rales. RRR.  Left wrist and hand is acutely inflamed and erythematous.  Tender to palpation along the carpals and metacarpals.  Acute gout flare.  History of chronic gout treated with allopurinol 200 mg twice daily per chart and patient notes.  He is also with elevated hypertension symptoms today having not taken any medication for several weeks.  Prescribing prednisone course to treat the acute gout flare.  Will represcribe allopurinol at 100 mg daily given we have no recent lab values regarding kidney function and uric acid levels.  Chart shows history of AKI.  Also represcribing hypertension medications at previously prescribed dosages.  We will facilitate establishing care with a PCP who can follow-up on any other dose adjustments that are necessary.  Reviewed chart history regarding lab results and chronic history.  Counseled patient on potential for adverse effects with medications prescribed/recommended today, ER and return-to-clinic precautions discussed, patient verbalized understanding and agreement with care plan.   Final Clinical Impressions(s) / UC Diagnoses   Final diagnoses:  None   Discharge  Instructions   None    ED Prescriptions   None    PDMP not reviewed this encounter.   [name], FNP 04/23/23 1059

## 2023-04-23 NOTE — ED Triage Notes (Signed)
Patient c/o pain to left wrist that began 3 days ago. The patient states he does have a hcx of gout.  Home interventions: none

## 2023-07-21 ENCOUNTER — Ambulatory Visit
Admission: RE | Admit: 2023-07-21 | Discharge: 2023-07-21 | Disposition: A | Discharge status: Home / Self Care | Payer: Self-pay | Source: Ambulatory Visit | Attending: Emergency Medicine | Admitting: Emergency Medicine

## 2023-07-21 VITALS — BP 131/86 | HR 84 | Temp 97.7°F | Resp 16

## 2023-07-21 DIAGNOSIS — M1000 Idiopathic gout, unspecified site: Secondary | ICD-10-CM

## 2023-07-21 DIAGNOSIS — I10 Essential (primary) hypertension: Secondary | ICD-10-CM

## 2023-07-21 DIAGNOSIS — Z760 Encounter for issue of repeat prescription: Secondary | ICD-10-CM

## 2023-07-21 MED ORDER — ATENOLOL 25 MG PO TABS: 12.5000 mg | ORAL_TABLET | Freq: Every day | ORAL | 2 refills | Status: DC

## 2023-07-21 MED ORDER — LISINOPRIL 40 MG PO TABS: 40.0000 mg | ORAL_TABLET | Freq: Every day | ORAL | 2 refills | Status: DC

## 2023-07-21 MED ORDER — COLCHICINE 0.6 MG PO TABS: ORAL_TABLET | ORAL | 0 refills | Status: DC

## 2023-07-21 MED ORDER — AMLODIPINE BESYLATE 10 MG PO TABS: 10.0000 mg | ORAL_TABLET | Freq: Every day | ORAL | 2 refills | Status: DC

## 2023-07-21 MED ORDER — ALLOPURINOL 100 MG PO TABS: 100.0000 mg | ORAL_TABLET | Freq: Every day | ORAL | 2 refills | Status: DC

## 2023-07-21 NOTE — ED Triage Notes (Signed)
Patient presents to [facility] for medication refill for gout and HTN. States he does not have a PCP. Takes Amlodipine, atenolol, lisinopril for HTN. Allopurinol and colchicine for gout.

## 2023-07-21 NOTE — Discharge Instructions (Signed)
Allopurinol, amlodipine, atenolol and lisinopril have been refilled for 3 months  Colchicine has been refilled as a 30-day supply as this is a medication use only for flareups  Take all occasion as directed  You have been scheduled a primary care appointment so appropriate blood work and monitoring can be completed for your heart in relation to high blood pressure, cholesterol, diabetes, kidney, liver   You may follow-up with his urgent care as needed

## 2023-07-21 NOTE — ED Provider Notes (Signed)
Jackson Mcguire    CSN: [id number] Arrival date & time: 07/21/23  1503      History   Chief Complaint Chief Complaint  Patient presents with   Medication Refill    HPI Jackson Mcguire. is a 42 y.o. male.   Patient presents requesting medication refill for hypertension medications gout medicine.  Does not currently have a primary care doctor.  Currently taking all medications as directed without missing dosages.  Denies all symptoms.  Past Medical History:  Diagnosis Date   Gout     Patient Active Problem List   Diagnosis Date Noted   Transaminitis 06/22/2021   Gout 06/21/2021   AKI (acute kidney injury) (HCC) 06/21/2021   Primary hypertension 01/16/2021   Inflammatory arthritis 01/16/2021    History reviewed. No pertinent surgical history.     Home Medications    Prior to Admission medications   Medication Sig Start Date End Date Taking? Authorizing Provider  acetaminophen (TYLENOL) 325 MG tablet Take by mouth. 09/29/17   [provider]  allopurinol (ZYLOPRIM) 100 MG tablet Take 1 tablet (100 mg total) by mouth daily. 07/21/23 10/19/23  Valinda Hoar, NP  amLODipine (NORVASC) 10 MG tablet Take 1 tablet (10 mg total) by mouth daily. 07/21/23 10/19/23  Valinda Hoar, NP  atenolol (TENORMIN) 25 MG tablet Take 0.5 tablets (12.5 mg total) by mouth daily. 07/21/23 10/19/23  Valinda Hoar, NP  colchicine 0.6 MG tablet Day 1: Take 2 tablets (1.2 mg) at the first sign of flare, followed by 1 tablet (0.6 mg) after 1 hour; maximum total dose: 1.8 mg/day on day 1. Initiate as soon as possible, ideally within 12 to 24 hours of flare onset. Day 2 and thereafter: Take 1 tablet (0.6 mg) once or twice daily until flare resolves. 07/21/23   Kriti Katayama, Elita Boone, NP  ibuprofen (ADVIL) 800 MG tablet Take 1 tablet (800 mg total) by mouth 3 (three) times daily. 08/12/22   Brimage, Seward Meth, DO  lisinopril (ZESTRIL) 40 MG tablet Take 1 tablet (40 mg total) by mouth daily. 07/21/23  10/19/23  Valinda Hoar, NP  omeprazole (PRILOSEC) 20 MG capsule Take by mouth. 09/29/17   [provider]  traMADol (ULTRAM) 50 MG tablet Take 1 tablet (50 mg total) by mouth 2 (two) times daily. 08/30/16   [name], PA-C    Family History History reviewed. No pertinent family history.  Social History Social History   Tobacco Use   Smoking status: Some Days    Current packs/day: 1.00    Types: Cigarettes   Smokeless tobacco: Never  Vaping Use   Vaping status: Never Used  Substance Use Topics   Alcohol use: Yes    Comment: few drinks per night   Drug use: No     Allergies   Patient has no known allergies.   Review of Systems Review of Systems   Physical Exam Triage Vital Signs ED Triage Vitals  Encounter Vitals Group     BP 07/21/23 1517 131/86     Systolic BP Percentile --      Diastolic BP Percentile --      Pulse Rate 07/21/23 1517 84     Resp 07/21/23 1517 16     Temp 07/21/23 1517 97.7 F (36.5 C)     Temp Source 07/21/23 1517 Temporal     SpO2 07/21/23 1517 96 %     Weight --      Height --  Head Circumference --      Peak Flow --      Pain Score 07/21/23 1516 0     Pain Loc --      Pain Education --      Exclude from Growth Chart --    No data found.  Updated Vital Signs BP 131/86 (BP Location: Left Arm)   Pulse 84   Temp 97.7 F (36.5 C) (Temporal)   Resp 16   SpO2 96%   Visual Acuity Right Eye Distance:   Left Eye Distance:   Bilateral Distance:    Right Eye Near:   Left Eye Near:    Bilateral Near:     Physical Exam Constitutional:      Appearance: Normal appearance.  Eyes:     Extraocular Movements: Extraocular movements intact.  Pulmonary:     Effort: Pulmonary effort is normal.  Neurological:     Mental Status: He is alert and oriented to person, place, and time. Mental status is at baseline.      UC Treatments / Results  Labs (all labs ordered are listed, but only abnormal results are  displayed) Labs Reviewed - No data to display  EKG   Radiology No results found.  Procedures Procedures (including critical care time)  Medications Ordered in UC Medications - No data to display  Initial Impression / Assessment and Plan / UC Course  I have reviewed the triage vital signs and the nursing notes.  Pertinent labs & imaging results that were available during my care of the patient were reviewed by me and considered in my medical decision making (see chart for details).  Encounter for medication refill, essential hypertension, acute idiopathic gout  Vital signs are stable patient is in no signs of distress nontoxic-appearing, no signs of hypertensive urgency, currently not experiencing a gout flare, allopurinol, amlodipine atenolol and lisinopril refilled for 3 months, colchicine given 30-day supply for flares, PCP appointment scheduled prior to discharge, may follow-up with urgent care as needed Final Clinical Impressions(s) / UC Diagnoses   Final diagnoses:  Essential hypertension  Acute idiopathic gout, unspecified site  Encounter for medication refill     Discharge Instructions      Allopurinol, amlodipine, atenolol and lisinopril have been refilled for 3 months  Colchicine has been refilled as a 30-day supply as this is a medication use only for flareups  Take all occasion as directed  You have been scheduled a primary care appointment so appropriate blood work and monitoring can be completed for your heart in relation to high blood pressure, cholesterol, diabetes, kidney, liver   You may follow-up with his urgent care as needed   ED Prescriptions     Medication Sig Dispense Auth. Provider   allopurinol (ZYLOPRIM) 100 MG tablet Take 1 tablet (100 mg total) by mouth daily. 30 tablet [place] Luppino R, NP   amLODipine (NORVASC) 10 MG tablet Take 1 tablet (10 mg total) by mouth daily. 30 tablet Khloey Chern R, NP   atenolol (TENORMIN) 25 MG tablet  Take 0.5 tablets (12.5 mg total) by mouth daily. 30 tablet Ron Beske R, NP   colchicine 0.6 MG tablet Day 1: Take 2 tablets (1.2 mg) at the first sign of flare, followed by 1 tablet (0.6 mg) after 1 hour; maximum total dose: 1.8 mg/day on day 1. Initiate as soon as possible, ideally within 12 to 24 hours of flare onset. Day 2 and thereafter: Take 1 tablet (0.6 mg) once or twice daily until  flare resolves. 30 tablet Kenon Delashmit R, NP   lisinopril (ZESTRIL) 40 MG tablet Take 1 tablet (40 mg total) by mouth daily. 30 tablet Valinda Hoar, NP      PDMP not reviewed this encounter.   Valinda Hoar, NP 07/21/23 1550

## 2023-09-21 ENCOUNTER — Encounter: Payer: Self-pay | Admitting: Nurse Practitioner

## 2023-09-21 ENCOUNTER — Other Ambulatory Visit: Payer: Self-pay

## 2023-09-21 ENCOUNTER — Ambulatory Visit: Payer: Self-pay | Admitting: Nurse Practitioner

## 2023-09-21 VITALS — BP 148/86 | HR 98 | Temp 98.3°F | Resp 16 | Ht 70.0 in | Wt 205.2 lb

## 2023-09-21 DIAGNOSIS — Z131 Encounter for screening for diabetes mellitus: Secondary | ICD-10-CM

## 2023-09-21 DIAGNOSIS — M1A9XX0 Chronic gout, unspecified, without tophus (tophi): Secondary | ICD-10-CM

## 2023-09-21 DIAGNOSIS — Z114 Encounter for screening for human immunodeficiency virus [HIV]: Secondary | ICD-10-CM

## 2023-09-21 DIAGNOSIS — Z130 Encounter for screening for diseases of the blood and blood-forming organs and certain disorders involving the immune mechanism: Secondary | ICD-10-CM

## 2023-09-21 DIAGNOSIS — Z1159 Encounter for screening for other viral diseases: Secondary | ICD-10-CM

## 2023-09-21 DIAGNOSIS — I10 Essential (primary) hypertension: Secondary | ICD-10-CM

## 2023-09-21 DIAGNOSIS — F419 Anxiety disorder, unspecified: Secondary | ICD-10-CM | POA: Insufficient documentation

## 2023-09-21 DIAGNOSIS — Z7689 Persons encountering health services in other specified circumstances: Secondary | ICD-10-CM

## 2023-09-21 DIAGNOSIS — Z13220 Encounter for screening for lipoid disorders: Secondary | ICD-10-CM

## 2023-09-21 MED ORDER — SERTRALINE HCL 25 MG PO TABS: 25.0000 mg | ORAL_TABLET | Freq: Every day | ORAL | 0 refills | Status: DC

## 2023-09-21 NOTE — Assessment & Plan Note (Signed)
Continue  atenolol 12.5 mg daily, amlodipine 10 mg daily, lisinopril 40 mg daily

## 2023-09-21 NOTE — Assessment & Plan Note (Signed)
Continue allopurinol 100 mg daily and colchicine as needed for flares

## 2023-09-21 NOTE — Progress Notes (Signed)
BP (!) 148/86   Pulse 98   Temp 98.3 F (36.8 C) (Oral)   Resp 16   Ht 5\' 10"  (1.778 m)   Wt 205 lb 3.2 oz (93.1 kg)   SpO2 97%   BMI 29.44 kg/m    Subjective:    Patient ID: Jackson Levers., male    DOB: May 30, 1981, 42 y.o.   MRN: [id number]  HPI: Jackson Lerette. is a 42 y.o. male  Chief Complaint  Patient presents with   Establish Care   Hypertension   Anxiety   Gout   Establish care: his last physical was unsure.  Medical history includes htn, gout .  Family history includes HTN, gout.  Health Maintenance due for labs.   Hypertension:  -Medications: atenolol 12.5 mg daily, amlodipine 10 mg daily, lisinopril 40 mg daily -Patient is compliant with above medications and reports no side effects. -Checking BP at home (average): 150s -Denies any SOB, CP, vision changes, LE edema or symptoms of hypotension -Diet: recommend DASH diet  -Exercise: recommend 150 min of physical activity weekly       09/21/2023    3:35 PM 09/21/2023    3:17 PM 07/21/2023    3:17 PM  Vitals with BMI  Height  5\' 10"    Weight  205 lbs 3 oz   BMI  29.44   Systolic 148 148 119  Diastolic 86 80 86  Pulse  98 84    Gout: currently takes allopurinol 100 mg daily and colchicine as needed for gout flares.   Anxiety: he says he was treated a long time ago does not remember what he took.  He says he notices the anxiety more when he is getting ready for work.      09/21/2023    3:19 PM  GAD 7 : Generalized Anxiety Score  Nervous, Anxious, on Edge 1  Control/stop worrying 0  Worry too much - different things 1  Trouble relaxing 0  Restless 1  Easily annoyed or irritable 0  Afraid - awful might happen 1  Total GAD 7 Score 4  Anxiety Difficulty Not difficult at all      09/21/2023    3:18 PM  Depression screen PHQ 2/9  Decreased Interest 0  Down, Depressed, Hopeless 0  PHQ - 2 Score 0  Altered sleeping 2  Tired, decreased energy 0  Change in appetite 0  Feeling bad or failure about  yourself  0  Trouble concentrating 0  Moving slowly or fidgety/restless 0  Suicidal thoughts 0  PHQ-9 Score 2  Difficult doing work/chores Not difficult at all      Relevant past medical, surgical, family and social history reviewed and updated as indicated. Interim medical history since our last visit reviewed. Allergies and medications reviewed and updated.  Review of Systems  Constitutional: Negative for fever or weight change.  Respiratory: Negative for cough and shortness of breath.   Cardiovascular: Negative for chest pain or palpitations.  Gastrointestinal: Negative for abdominal pain, no bowel changes.  Musculoskeletal: Negative for gait problem or joint swelling.  Skin: Negative for rash.  Neurological: Negative for dizziness or headache.  No other specific complaints in a complete review of systems (except as listed in HPI above).      Objective:    BP (!) 148/86   Pulse 98   Temp 98.3 F (36.8 C) (Oral)   Resp 16   Ht 5\' 10"  (1.778 m)   Wt 205 lb 3.2  oz (93.1 kg)   SpO2 97%   BMI 29.44 kg/m   Wt Readings from Last 3 Encounters:  09/21/23 205 lb 3.2 oz (93.1 kg)  08/12/22 190 lb (86.2 kg)  04/30/22 185 lb (83.9 kg)    Physical Exam  Constitutional: Patient appears well-developed and well-nourished.  No distress.  HEENT: head atraumatic, normocephalic, pupils equal and reactive to light, neck supple Cardiovascular: Normal rate, regular rhythm and normal heart sounds.  No murmur heard. No BLE edema. Pulmonary/Chest: Effort normal and breath sounds normal. No respiratory distress. Abdominal: Soft.  There is no tenderness. Psychiatric: Patient has a normal mood and affect. behavior is normal. Judgment and thought content normal.  Results for orders placed or performed during the hospital encounter of 12/12/15  Basic metabolic panel  Result Value Ref Range   Sodium 137 135 - 145 mmol/L   Potassium 4.7 3.5 - 5.1 mmol/L   Chloride 100 (L) 101 - 111 mmol/L    CO2 26 22 - 32 mmol/L   Glucose, Bld 94 65 - 99 mg/dL   BUN 10 6 - 20 mg/dL   Creatinine, Ser 1.61 0.61 - 1.24 mg/dL   Calcium 9.4 8.9 - 09.6 mg/dL   GFR calc non Af Amer >60 >60 mL/min   GFR calc Af Amer >60 >60 mL/min   Anion gap 11 5 - 15  CBC  Result Value Ref Range   WBC 10.2 4.0 - 10.5 K/uL   RBC 4.99 4.22 - 5.81 MIL/uL   Hemoglobin 16.0 13.0 - 17.0 g/dL   HCT 04.5 40.9 - 81.1 %   MCV 91.8 78.0 - 100.0 fL   MCH 32.1 26.0 - 34.0 pg   MCHC 34.9 30.0 - 36.0 g/dL   RDW 91.4 78.2 - 95.6 %   Platelets 181 150 - 400 K/uL  Urinalysis, Routine w reflex microscopic (not at [facility])  Result Value Ref Range   Color, Urine YELLOW YELLOW   APPearance CLEAR CLEAR   Specific Gravity, Urine 1.028 1.005 - 1.030   pH 6.0 5.0 - 8.0   Glucose, UA NEGATIVE NEGATIVE mg/dL   Hgb urine dipstick SMALL (A) NEGATIVE   Bilirubin Urine NEGATIVE NEGATIVE   Ketones, ur NEGATIVE NEGATIVE mg/dL   Protein, ur NEGATIVE NEGATIVE mg/dL   Nitrite NEGATIVE NEGATIVE   Leukocytes, UA NEGATIVE NEGATIVE  Urine microscopic-add on  Result Value Ref Range   Squamous Epithelial / HPF 0-5 (A) NONE SEEN   WBC, UA NONE SEEN 0 - 5 WBC/hpf   RBC / HPF 0-5 0 - 5 RBC/hpf   Bacteria, UA NONE SEEN NONE SEEN   Urine-Other MUCOUS PRESENT   CBG monitoring, ED  Result Value Ref Range   Glucose-Capillary 85 65 - 99 mg/dL   Comment 1 Notify RN    Comment 2 Document in Chart       Assessment & Plan:   Problem List Items Addressed This Visit       Cardiovascular and Mediastinum   Primary hypertension - Primary    Continue  atenolol 12.5 mg daily, amlodipine 10 mg daily, lisinopril 40 mg daily      Relevant Orders   CBC with Differential/Platelet   COMPLETE METABOLIC PANEL WITH GFR     Other   Gout    Continue allopurinol 100 mg daily and colchicine as needed for flares      Relevant Orders   Uric acid   Anxiety    Start zoloft 25 mg daily, follow up in 4  weeks.       Relevant Medications   sertraline  (ZOLOFT) 25 MG tablet   Other Visit Diagnoses     Encounter to establish care       Screening for diabetes mellitus       Relevant Orders   COMPLETE METABOLIC PANEL WITH GFR   Hemoglobin A1c   Screening for deficiency anemia       Relevant Orders   CBC with Differential/Platelet   Screening for cholesterol level       Relevant Orders   Lipid panel   Encounter for hepatitis C screening test for low risk patient       Relevant Orders   Hepatitis C antibody   Screening for HIV without presence of risk factors       Relevant Orders   HIV Antibody (routine testing w rflx)        Follow up plan: Return in about 4 weeks (around 10/19/2023) for follow up.

## 2023-09-21 NOTE — Assessment & Plan Note (Signed)
Start zoloft 25 mg daily, follow up in 4 weeks

## 2023-09-22 LAB — COMPLETE METABOLIC PANEL WITH GFR
AG Ratio: 2 (calc) (ref 1.0–2.5)
ALT: 78 U/L — ABNORMAL HIGH (ref 9–46)
AST: 67 U/L — ABNORMAL HIGH (ref 10–40)
Albumin: 5.1 g/dL (ref 3.6–5.1)
Alkaline phosphatase (APISO): 107 U/L (ref 36–130)
BUN: 20 mg/dL (ref 7–25)
CO2: 29 mmol/L (ref 20–32)
Calcium: 10.5 mg/dL — ABNORMAL HIGH (ref 8.6–10.3)
Chloride: 101 mmol/L (ref 98–110)
Creat: 0.99 mg/dL (ref 0.60–1.29)
Globulin: 2.6 g/dL (ref 1.9–3.7)
Glucose, Bld: 87 mg/dL (ref 65–99)
Potassium: 5.3 mmol/L (ref 3.5–5.3)
Sodium: 139 mmol/L (ref 135–146)
Total Bilirubin: 0.6 mg/dL (ref 0.2–1.2)
Total Protein: 7.7 g/dL (ref 6.1–8.1)
eGFR: 98 mL/min/{1.73_m2} (ref >=60)

## 2023-09-22 LAB — CBC WITH DIFFERENTIAL/PLATELET
Absolute Monocytes: 721 {cells}/uL (ref 200–950)
Basophils Absolute: 81 {cells}/uL (ref 0–200)
Basophils Relative: 1 %
Eosinophils Absolute: 259 {cells}/uL (ref 15–500)
Eosinophils Relative: 3.2 %
HCT: 43.2 % (ref 38.5–50.0)
Hemoglobin: 14.5 g/dL (ref 13.2–17.1)
Lymphs Abs: 3248 {cells}/uL (ref 850–3900)
MCH: 31.4 pg (ref 27.0–33.0)
MCHC: 33.6 g/dL (ref 32.0–36.0)
MCV: 93.5 fL (ref 80.0–100.0)
MPV: 9.6 fL (ref 7.5–12.5)
Monocytes Relative: 8.9 %
Neutro Abs: 3791 {cells}/uL (ref 1500–7800)
Neutrophils Relative %: 46.8 %
Platelets: 228 10*3/uL (ref 140–400)
RBC: 4.62 10*6/uL (ref 4.20–5.80)
RDW: 12.4 % (ref 11.0–15.0)
Total Lymphocyte: 40.1 %
WBC: 8.1 10*3/uL (ref 3.8–10.8)

## 2023-09-22 LAB — LIPID PANEL
Cholesterol: 262 mg/dL — ABNORMAL HIGH (ref <200)
HDL: 79 mg/dL (ref >=40)
LDL Cholesterol (Calc): 145 mg/dL — ABNORMAL HIGH
Non-HDL Cholesterol (Calc): 183 mg/dL — ABNORMAL HIGH (ref <130)
Total CHOL/HDL Ratio: 3.3 (calc) (ref <5.0)
Triglycerides: 235 mg/dL — ABNORMAL HIGH (ref <150)

## 2023-09-22 LAB — HEMOGLOBIN A1C
Hgb A1c MFr Bld: 5.3 %{Hb} (ref <5.7)
Mean Plasma Glucose: 105 mg/dL
eAG (mmol/L): 5.8 mmol/L

## 2023-09-22 LAB — HIV ANTIBODY (ROUTINE TESTING W REFLEX): HIV 1&2 Ab, 4th Generation: NONREACTIVE

## 2023-09-22 LAB — HEPATITIS C ANTIBODY: Hepatitis C Ab: NONREACTIVE

## 2023-09-22 LAB — URIC ACID: Uric Acid, Serum: 6.1 mg/dL (ref 4.0–8.0)

## 2023-10-27 ENCOUNTER — Other Ambulatory Visit: Payer: Self-pay

## 2023-10-27 ENCOUNTER — Encounter: Payer: Self-pay | Admitting: Nurse Practitioner

## 2023-10-27 ENCOUNTER — Ambulatory Visit (INDEPENDENT_AMBULATORY_CARE_PROVIDER_SITE_OTHER): Payer: Self-pay | Admitting: Nurse Practitioner

## 2023-10-27 VITALS — BP 136/84 | HR 92 | Temp 97.9°F | Resp 16 | Ht 70.0 in | Wt 209.1 lb

## 2023-10-27 DIAGNOSIS — M1A9XX0 Chronic gout, unspecified, without tophus (tophi): Secondary | ICD-10-CM

## 2023-10-27 DIAGNOSIS — I10 Essential (primary) hypertension: Secondary | ICD-10-CM

## 2023-10-27 DIAGNOSIS — F419 Anxiety disorder, unspecified: Secondary | ICD-10-CM

## 2023-10-27 MED ORDER — AMLODIPINE BESYLATE 10 MG PO TABS: 10.0000 mg | ORAL_TABLET | Freq: Every day | ORAL | 1 refills | Status: DC

## 2023-10-27 MED ORDER — ATENOLOL 25 MG PO TABS: 12.5000 mg | ORAL_TABLET | Freq: Every day | ORAL | 1 refills | Status: DC

## 2023-10-27 MED ORDER — COLCHICINE 0.6 MG PO TABS: ORAL_TABLET | ORAL | 0 refills | Status: DC

## 2023-10-27 MED ORDER — LISINOPRIL 40 MG PO TABS: 40.0000 mg | ORAL_TABLET | Freq: Every day | ORAL | 1 refills | Status: DC

## 2023-10-27 MED ORDER — ALLOPURINOL 100 MG PO TABS: 100.0000 mg | ORAL_TABLET | Freq: Every day | ORAL | 1 refills | Status: DC

## 2023-10-27 NOTE — Assessment & Plan Note (Signed)
Refill on allopurinol and colchicine sent into pharmacy.

## 2023-10-27 NOTE — Assessment & Plan Note (Signed)
Refill sent in.  Condition stable.

## 2023-10-27 NOTE — Assessment & Plan Note (Signed)
Doing well without medication.  

## 2023-10-27 NOTE — Progress Notes (Signed)
BP 136/84   Pulse 92   Temp 97.9 F (36.6 C) (Oral)   Resp 16   Ht 5\' 10"  (1.778 m)   Wt 209 lb 1.6 oz (94.8 kg)   SpO2 96%   BMI 30.00 kg/m    Subjective:    Patient ID: Jackson Levers., male    DOB: 1981-04-03, 42 y.o.   MRN: [id number]  HPI: Jackson Popko. is a 42 y.o. male  Chief Complaint  Patient presents with   Hypertension   Gout   Hypertension:  -Medications: atenolol 12.5 mg daily, amlodipine 10 mg daily, lisinopril 40 mg daily  -Patient is compliant with above medications and reports no side effects. Reports he ran out of his one of medication -Checking BP at home (average): 130s -Denies any SOB, CP, vision changes, LE edema or symptoms of hypotension -Diet: recommend DASH diet  -Exercise: recommend 150 min of physical activity weekly       10/27/2023    3:20 PM 09/21/2023    3:35 PM 09/21/2023    3:17 PM  Vitals with BMI  Height 5\' 10"   5\' 10"   Weight 209 lbs 2 oz  205 lbs 3 oz  BMI 30  29.44  Systolic 136 148 409  Diastolic 84 86 80  Pulse 92  98   Anxiety- here for 4 week follow up after starting zoloft  Medication zoloft 25 mg daily Compliant no, stopped taking it, did not like how it made him feel, reports doing well without medicaiton PHQ9 negative GAD negative     10/27/2023    3:20 PM 09/21/2023    3:18 PM  Depression screen PHQ 2/9  Decreased Interest 0 0  Down, Depressed, Hopeless 0 0  PHQ - 2 Score 0 0  Altered sleeping 0 2  Tired, decreased energy 0 0  Change in appetite 0 0  Feeling bad or failure about yourself  0 0  Trouble concentrating 0 0  Moving slowly or fidgety/restless 0 0  Suicidal thoughts 0 0  PHQ-9 Score 0 2  Difficult doing work/chores Not difficult at all Not difficult at all       10/27/2023    3:20 PM 09/21/2023    3:19 PM  GAD 7 : Generalized Anxiety Score  Nervous, Anxious, on Edge 0 1  Control/stop worrying 0 0  Worry too much - different things 0 1  Trouble relaxing 0 0  Restless 0 1  Easily  annoyed or irritable 0 0  Afraid - awful might happen 0 1  Total GAD 7 Score 0 4  Anxiety Difficulty Not difficult at all Not difficult at all   Gout: condition stable last flare was 2 weeks ago. needs refill of allopurinol, refill sent.    Relevant past medical, surgical, family and social history reviewed and updated as indicated. Interim medical history since our last visit reviewed. Allergies and medications reviewed and updated.  Review of Systems  Constitutional: Negative for fever or weight change.  Respiratory: Negative for cough and shortness of breath.   Cardiovascular: Negative for chest pain or palpitations.  Gastrointestinal: Negative for abdominal pain, no bowel changes.  Musculoskeletal: Negative for gait problem or joint swelling.  Skin: Negative for rash.  Neurological: Negative for dizziness or headache.  No other specific complaints in a complete review of systems (except as listed in HPI above).      Objective:    BP 136/84   Pulse 92   Temp 97.9  F (36.6 C) (Oral)   Resp 16   Ht 5\' 10"  (1.778 m)   Wt 209 lb 1.6 oz (94.8 kg)   SpO2 96%   BMI 30.00 kg/m   Wt Readings from Last 3 Encounters:  10/27/23 209 lb 1.6 oz (94.8 kg)  09/21/23 205 lb 3.2 oz (93.1 kg)  08/12/22 190 lb (86.2 kg)    Physical Exam  Constitutional: Patient appears well-developed and well-nourished.  No distress.  HEENT: head atraumatic, normocephalic, pupils equal and reactive to light, neck supple Cardiovascular: Normal rate, regular rhythm and normal heart sounds.  No murmur heard. No BLE edema. Pulmonary/Chest: Effort normal and breath sounds normal. No respiratory distress. Abdominal: Soft.  There is no tenderness. Psychiatric: Patient has a normal mood and affect. behavior is normal. Judgment and thought content normal.  Results for orders placed or performed in visit on 09/21/23  CBC with Differential/Platelet  Result Value Ref Range   WBC 8.1 3.8 - 10.8 Thousand/uL   RBC  4.62 4.20 - 5.80 Million/uL   Hemoglobin 14.5 13.2 - 17.1 g/dL   HCT 16.1 09.6 - 04.5 %   MCV 93.5 80.0 - 100.0 fL   MCH 31.4 27.0 - 33.0 pg   MCHC 33.6 32.0 - 36.0 g/dL   RDW 40.9 81.1 - 91.4 %   Platelets 228 140 - 400 Thousand/uL   MPV 9.6 7.5 - 12.5 fL   Neutro Abs 3,791 1,500 - 7,800 cells/uL   Lymphs Abs 3,248 850 - 3,900 cells/uL   Absolute Monocytes 721 200 - 950 cells/uL   Eosinophils Absolute 259 15 - 500 cells/uL   Basophils Absolute 81 0 - 200 cells/uL   Neutrophils Relative % 46.8 %   Total Lymphocyte 40.1 %   Monocytes Relative 8.9 %   Eosinophils Relative 3.2 %   Basophils Relative 1.0 %  COMPLETE METABOLIC PANEL WITH GFR  Result Value Ref Range   Glucose, Bld 87 65 - 99 mg/dL   BUN 20 7 - 25 mg/dL   Creat 7.82 9.56 - 2.13 mg/dL   eGFR 98 > OR = 60 YQ/MVH/8.46N6   BUN/Creatinine Ratio SEE NOTE: 6 - 22 (calc)   Sodium 139 135 - 146 mmol/L   Potassium 5.3 3.5 - 5.3 mmol/L   Chloride 101 98 - 110 mmol/L   CO2 29 20 - 32 mmol/L   Calcium 10.5 (H) 8.6 - 10.3 mg/dL   Total Protein 7.7 6.1 - 8.1 g/dL   Albumin 5.1 3.6 - 5.1 g/dL   Globulin 2.6 1.9 - 3.7 g/dL (calc)   AG Ratio 2.0 1.0 - 2.5 (calc)   Total Bilirubin 0.6 0.2 - 1.2 mg/dL   Alkaline phosphatase (APISO) 107 36 - 130 U/L   AST 67 (H) 10 - 40 U/L   ALT 78 (H) 9 - 46 U/L  Lipid panel  Result Value Ref Range   Cholesterol 262 (H) <200 mg/dL   HDL 79 > OR = 40 mg/dL   Triglycerides 295 (H) <150 mg/dL   LDL Cholesterol (Calc) 145 (H) mg/dL (calc)   Total CHOL/HDL Ratio 3.3 <5.0 (calc)   Non-HDL Cholesterol (Calc) 183 (H) <130 mg/dL (calc)  Hemoglobin M8U  Result Value Ref Range   Hgb A1c MFr Bld 5.3 <5.7 % of total Hgb   Mean Plasma Glucose 105 mg/dL   eAG (mmol/L) 5.8 mmol/L  Hepatitis C antibody  Result Value Ref Range   Hepatitis C Ab NON-REACTIVE NON-REACTIVE  HIV Antibody (routine testing w rflx)  Result Value Ref Range   HIV 1&2 Ab, 4th Generation NON-REACTIVE NON-REACTIVE  Uric acid   Result Value Ref Range   Uric Acid, Serum 6.1 4.0 - 8.0 mg/dL      Assessment & Plan:   Problem List Items Addressed This Visit       Cardiovascular and Mediastinum   Primary hypertension    Refill sent in.  Condition stable.      Relevant Medications   lisinopril (ZESTRIL) 40 MG tablet   amLODipine (NORVASC) 10 MG tablet   atenolol (TENORMIN) 25 MG tablet     Other   Gout    Refill on allopurinol and colchicine sent into pharmacy.      Relevant Medications   allopurinol (ZYLOPRIM) 100 MG tablet   colchicine 0.6 MG tablet   Anxiety - Primary    Doing well without medication.        Follow up plan: Return in about 6 months (around 04/25/2024) for follow up.

## 2024-03-04 ENCOUNTER — Ambulatory Visit
Admission: EM | Admit: 2024-03-04 | Discharge: 2024-03-04 | Disposition: A | Discharge status: Home / Self Care | Payer: Self-pay | Attending: Emergency Medicine | Admitting: Emergency Medicine

## 2024-03-04 DIAGNOSIS — M109 Gout, unspecified: Secondary | ICD-10-CM

## 2024-03-04 MED ORDER — PREDNISONE 10 MG (21) PO TBPK: ORAL_TABLET | Freq: Every day | ORAL | 0 refills | Status: DC

## 2024-03-04 NOTE — ED Provider Notes (Signed)
 Jackson Mcguire    CSN: [id number] Arrival date & time: 03/04/24  1046      History   Chief Complaint Chief Complaint  Patient presents with   Knee Pain    HPI Jackson Hehir. is a 43 y.o. male.  Patient presents with right knee pain and swelling x 3 days.  No trauma.  He states this is typical for his recurrent gout.  No fever, chills, wounds, numbness, weakness.  Patient is out of colchicine so he was unable to take it at the onset of his symptoms to "catch it early."  He states that he has needed prednisone in the past when the gout "gets this bad."  He takes allopurinol daily.  The history is provided by the patient and medical records.    Past Medical History:  Diagnosis Date   Anxiety    Gout    Gout    Hypertension     Patient Active Problem List   Diagnosis Date Noted   Anxiety 09/21/2023   Gout 06/21/2021   Primary hypertension 01/16/2021    Past Surgical History:  Procedure Laterality Date   APPENDECTOMY         Home Medications    Prior to Admission medications   Medication Sig Start Date End Date Taking? Authorizing Provider  predniSONE (STERAPRED UNI-PAK 21 TAB) 10 MG (21) TBPK tablet Take by mouth daily. As directed 03/04/24  Yes Mickie Bail, NP  acetaminophen (TYLENOL) 325 MG tablet Take by mouth. Patient not taking: Reported on 09/21/2023 09/29/17   [provider]  allopurinol (ZYLOPRIM) 100 MG tablet Take 1 tablet (100 mg total) by mouth daily. 10/27/23   Berniece Salines, FNP  amLODipine (NORVASC) 10 MG tablet Take 1 tablet (10 mg total) by mouth daily. 10/27/23 01/25/24  Berniece Salines, FNP  atenolol (TENORMIN) 25 MG tablet Take 0.5 tablets (12.5 mg total) by mouth daily. 10/27/23 01/25/24  Berniece Salines, FNP  colchicine 0.6 MG tablet Day 1: Take 2 tablets (1.2 mg) at the first sign of flare, followed by 1 tablet (0.6 mg) after 1 hour; maximum total dose: 1.8 mg/day on day 1. Initiate as soon as possible, ideally within 12 to  24 hours of flare onset. Day 2 and thereafter: Take 1 tablet (0.6 mg) once or twice daily until flare resolves. 10/27/23   Berniece Salines, FNP  ibuprofen (ADVIL) 800 MG tablet Take 1 tablet (800 mg total) by mouth 3 (three) times daily. 08/12/22   Brimage, Seward Meth, DO  lisinopril (ZESTRIL) 40 MG tablet Take 1 tablet (40 mg total) by mouth daily. 10/27/23 01/25/24  Berniece Salines, FNP    Family History Family History  Problem Relation Age of Onset   Hypertension Mother     Social History Social History   Tobacco Use   Smoking status: Some Days    Current packs/day: 1.00    Types: Cigarettes   Smokeless tobacco: Never  Vaping Use   Vaping status: Never Used  Substance Use Topics   Alcohol use: Yes    Comment: few drinks per night   Drug use: No     Allergies   Patient has no known allergies.   Review of Systems Review of Systems  Constitutional:  Negative for chills and fever.  Musculoskeletal:  Positive for arthralgias, gait problem and joint swelling.  Skin:  Negative for color change and wound.  Neurological:  Negative for weakness and numbness.     Physical  Exam Triage Vital Signs ED Triage Vitals  Encounter Vitals Group     BP 03/04/24 1238 132/88     Systolic BP Percentile --      Diastolic BP Percentile --      Pulse Rate 03/04/24 1238 79     Resp 03/04/24 1238 16     Temp 03/04/24 1238 98.3 F (36.8 C)     Temp src --      SpO2 03/04/24 1238 93 %     Weight --      Height --      Head Circumference --      Peak Flow --      Pain Score 03/04/24 1237 8     Pain Loc --      Pain Education --      Exclude from Growth Chart --    No data found.  Updated Vital Signs BP 132/88   Pulse 79   Temp 98.3 F (36.8 C)   Resp 16   SpO2 93%   Visual Acuity Right Eye Distance:   Left Eye Distance:   Bilateral Distance:    Right Eye Near:   Left Eye Near:    Bilateral Near:     Physical Exam Constitutional:      General: He is not in acute  distress. HENT:     Mouth/Throat:     Mouth: Mucous membranes are moist.  Cardiovascular:     Rate and Rhythm: Normal rate and regular rhythm.  Pulmonary:     Effort: Pulmonary effort is normal. No respiratory distress.  Musculoskeletal:        General: Swelling and tenderness present. No deformity. Normal range of motion.     Comments: Right knee tender and swollen.  No erythema or wounds.  Skin:    General: Skin is warm and dry.     Capillary Refill: Capillary refill takes less than 2 seconds.     Findings: No bruising, erythema, lesion or rash.  Neurological:     General: No focal deficit present.     Mental Status: He is alert and oriented to person, place, and time.     Sensory: No sensory deficit.     Motor: No weakness.     Gait: Gait abnormal.     Comments: Limping gait.      UC Treatments / Results  Labs (all labs ordered are listed, but only abnormal results are displayed) Labs Reviewed - No data to display  EKG   Radiology No results found.  Procedures Procedures (including critical care time)  Medications Ordered in UC Medications - No data to display  Initial Impression / Assessment and Plan / UC Course  I have reviewed the triage vital signs and the nursing notes.  Pertinent labs & imaging results that were available during my care of the patient were reviewed by me and considered in my medical decision making (see chart for details).    Acute gout of right knee.  No trauma.  Treating today with prednisone taper.  Instructed patient to follow-up with his PCP.  ED precautions given.  Education provided on gout.  He agrees to plan of care.  Final Clinical Impressions(s) / UC Diagnoses   Final diagnoses:  Acute gout of right knee, unspecified cause     Discharge Instructions      Take the prednisone as directed.  Follow-up with your primary care provider.  Go to the emergency department if you have worsening symptoms.  ED Prescriptions      Medication Sig Dispense Auth. Provider   predniSONE (STERAPRED UNI-PAK 21 TAB) 10 MG (21) TBPK tablet Take by mouth daily. As directed 21 tablet Mickie Bail, NP      PDMP not reviewed this encounter.   Mickie Bail, NP 03/04/24 [phone]

## 2024-03-04 NOTE — Discharge Instructions (Addendum)
 Take the prednisone as directed.  Follow-up with your primary care provider.  Go to the emergency department if you have worsening symptoms.

## 2024-03-04 NOTE — ED Triage Notes (Signed)
 Patient to Urgent Care with complaints of right sided knee pain. Denies any injury.  Reports hx of gout. States he takes allopurinol daily.  Three days of symptoms.

## 2024-04-23 ENCOUNTER — Ambulatory Visit: Payer: Self-pay | Admitting: Nurse Practitioner

## 2024-04-23 ENCOUNTER — Encounter: Payer: Self-pay | Admitting: Nurse Practitioner

## 2024-04-23 VITALS — BP 128/80 | HR 103 | Ht 70.0 in | Wt 210.4 lb

## 2024-04-23 DIAGNOSIS — Z6830 Body mass index (BMI) 30.0-30.9, adult: Secondary | ICD-10-CM

## 2024-04-23 DIAGNOSIS — E782 Mixed hyperlipidemia: Secondary | ICD-10-CM | POA: Insufficient documentation

## 2024-04-23 DIAGNOSIS — Z131 Encounter for screening for diabetes mellitus: Secondary | ICD-10-CM

## 2024-04-23 DIAGNOSIS — E6609 Other obesity due to excess calories: Secondary | ICD-10-CM

## 2024-04-23 DIAGNOSIS — I10 Essential (primary) hypertension: Secondary | ICD-10-CM

## 2024-04-23 DIAGNOSIS — M1A9XX0 Chronic gout, unspecified, without tophus (tophi): Secondary | ICD-10-CM

## 2024-04-23 DIAGNOSIS — E66811 Obesity, class 1: Secondary | ICD-10-CM

## 2024-04-23 DIAGNOSIS — D170 Benign lipomatous neoplasm of skin and subcutaneous tissue of head, face and neck: Secondary | ICD-10-CM

## 2024-04-23 MED ORDER — CONTRAVE 8-90 MG PO TB12: ORAL_TABLET | ORAL | 5 refills | Status: DC

## 2024-04-23 MED ORDER — ATENOLOL 25 MG PO TABS: 12.5000 mg | ORAL_TABLET | Freq: Every day | ORAL | 1 refills | Status: DC

## 2024-04-23 MED ORDER — COLCHICINE 0.6 MG PO TABS: ORAL_TABLET | ORAL | 0 refills | Status: DC

## 2024-04-23 MED ORDER — AMLODIPINE BESYLATE 10 MG PO TABS: 10.0000 mg | ORAL_TABLET | Freq: Every day | ORAL | 1 refills | Status: DC

## 2024-04-23 MED ORDER — ALLOPURINOL 100 MG PO TABS: 100.0000 mg | ORAL_TABLET | Freq: Every day | ORAL | 1 refills | Status: DC

## 2024-04-23 MED ORDER — LISINOPRIL 40 MG PO TABS: 40.0000 mg | ORAL_TABLET | Freq: Every day | ORAL | 1 refills | Status: DC

## 2024-04-23 NOTE — Progress Notes (Signed)
 BP 128/80   Pulse (!) 103   Ht 5\' 10"  (1.778 m)   Wt 210 lb 6.4 oz (95.4 kg)   SpO2 99%   BMI 30.19 kg/m    Subjective:    Patient ID: Jackson Khat., male    DOB: 10-13-1981, 43 y.o.   MRN: [id number]  HPI: Jackson Jurkovic. is a 43 y.o. male  Chief Complaint  Patient presents with   Medical Management of Chronic Issues    Discussed the use of AI scribe software for clinical note transcription with the patient, who gave verbal consent to proceed.  History of Present Illness Jackson Hotz. is a 43 year old male with hypertension, gout, and hyperlipidemia who presents for routine follow-up and management of his conditions.  He has a history of hypertension and is currently taking amlodipine  10 mg daily, atenolol  12.5 mg daily, and lisinopril  40 mg daily. He feels tired. His blood pressure is well-controlled at 128/80 mmHg.  He has a history of gout and is taking allopurinol  100 mg daily and colchicine  0.6 mg as needed. In the past six months, he has experienced three gout flare-ups, two of which were mild and managed with colchicine , while the third required a visit to urgent care for prednisone . Prior to medication, he frequently suffered from gout attacks.  He is managing hyperlipidemia, through diet.   He has a long-standing lump on his right jawline, described as a lipoma. It has been present for years and is bothersome, particularly when shaving, but it is not painful.  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 He weighs 210 pounds with a BMI of 30.19. He drinks a few cocktails and a bottle of wine nightly, which may be contributing to elevated liver enzymes noted in recent blood work.  His last blood work showed elevated AST and ALT levels, which have been elevated in the past. He acknowledges being a 'big drinker', which may be affecting his liver function.          04/23/2024    3:28 PM 04/23/2024    3:27 PM 10/27/2023    3:20 PM  Depression screen PHQ 2/9  Decreased Interest 0 0 0  Down, Depressed, Hopeless 0 0 0  PHQ - 2 Score 0 0 0  Altered sleeping 0  0  Tired, decreased energy 0  0  Change in appetite 0  0  Feeling bad or failure about yourself  0  0  Trouble concentrating 0  0  Moving slowly or fidgety/restless 0  0  Suicidal thoughts 0  0  PHQ-9 Score 0  0  Difficult doing work/chores Not difficult at all  Not difficult at all    Relevant past medical, surgical, family and social history reviewed and updated as indicated. Interim medical history since our last visit reviewed. Allergies and medications reviewed and updated.  Review of Systems  Constitutional: Negative for fever or weight change.  Respiratory: Negative for cough and shortness of breath.   Cardiovascular: Negative for chest pain or palpitations.  Gastrointestinal: Negative for abdominal pain, no bowel changes.  Musculoskeletal: Negative for gait problem or joint swelling.  Skin: Negative for rash.  Neurological: Negative for dizziness or headache.  No other specific complaints in a complete review of systems (except as listed in HPI above).      Objective:  BP 128/80   Pulse (!) 103   Ht 5\' 10"  (1.778 m)   Wt 210 lb 6.4 oz (95.4 kg)   SpO2 99%   BMI 30.19 kg/m    Wt Readings from Last 3 Encounters:  04/23/24 210 lb 6.4 oz (95.4 kg)  10/27/23 209 lb 1.6 oz (94.8 kg)  09/21/23 205 lb 3.2 oz (93.1 kg)    Physical Exam Vitals reviewed.  Constitutional:      Appearance: Normal appearance.  HENT:     Head: Normocephalic.  Cardiovascular:     Rate and Rhythm: Normal rate and regular rhythm.  Pulmonary:     Effort: Pulmonary effort is normal.     Breath sounds: Normal breath sounds.  Musculoskeletal:        General: Normal range of motion.  Skin:    General: Skin is warm and dry.  Neurological:     General: No focal deficit present.      Mental Status: He is alert and oriented to person, place, and time. Mental status is at baseline.  Psychiatric:        Mood and Affect: Mood normal.        Behavior: Behavior normal.        Thought Content: Thought content normal.        Judgment: Judgment normal.    Physical Exam    Results for orders placed or performed in visit on 09/21/23  CBC with Differential/Platelet   Collection Time: 09/21/23  3:37 PM  Result Value Ref Range   WBC 8.1 3.8 - 10.8 Thousand/uL   RBC 4.62 4.20 - 5.80 Million/uL   Hemoglobin 14.5 13.2 - 17.1 g/dL   HCT 16.1 09.6 - 04.5 %   MCV 93.5 80.0 - 100.0 fL   MCH 31.4 27.0 - 33.0 pg   MCHC 33.6 32.0 - 36.0 g/dL   RDW 40.9 81.1 - 91.4 %   Platelets 228 140 - 400 Thousand/uL   MPV 9.6 7.5 - 12.5 fL   Neutro Abs 3,791 1,500 - 7,800 cells/uL   Lymphs Abs 3,248 850 - 3,900 cells/uL   Absolute Monocytes 721 200 - 950 cells/uL   Eosinophils Absolute 259 15 - 500 cells/uL   Basophils Absolute 81 0 - 200 cells/uL   Neutrophils Relative % 46.8 %   Total Lymphocyte 40.1 %   Monocytes Relative 8.9 %   Eosinophils Relative 3.2 %   Basophils Relative 1.0 %  COMPLETE METABOLIC PANEL WITH GFR   Collection Time: 09/21/23  3:37 PM  Result Value Ref Range   Glucose, Bld 87 65 - 99 mg/dL   BUN 20 7 - 25 mg/dL   Creat 7.82 9.56 - 2.13 mg/dL   eGFR 98 > OR = 60 YQ/MVH/8.46N6   BUN/Creatinine Ratio SEE NOTE: 6 - 22 (calc)   Sodium 139 135 - 146 mmol/L   Potassium 5.3 3.5 - 5.3 mmol/L   Chloride 101 98 - 110 mmol/L   CO2 29 20 - 32 mmol/L   Calcium 10.5 (H) 8.6 - 10.3 mg/dL   Total Protein 7.7 6.1 - 8.1 g/dL   Albumin 5.1 3.6 - 5.1 g/dL   Globulin 2.6 1.9 - 3.7 g/dL (calc)   AG Ratio 2.0 1.0 - 2.5 (calc)   Total Bilirubin 0.6 0.2 - 1.2 mg/dL   Alkaline phosphatase (APISO) 107 36 - 130 U/L   AST 67 (H) 10 - 40 U/L   ALT 78 (H) 9 - 46 U/L  Lipid panel  Collection Time: 09/21/23  3:37 PM  Result Value Ref Range   Cholesterol 262 (H) <200 mg/dL   HDL 79 >  OR = 40 mg/dL   Triglycerides 811 (H) <150 mg/dL   LDL Cholesterol (Calc) 145 (H) mg/dL (calc)   Total CHOL/HDL Ratio 3.3 <5.0 (calc)   Non-HDL Cholesterol (Calc) 183 (H) <130 mg/dL (calc)  Hemoglobin B1Y   Collection Time: 09/21/23  3:37 PM  Result Value Ref Range   Hgb A1c MFr Bld 5.3 <5.7 % of total Hgb   Mean Plasma Glucose 105 mg/dL   eAG (mmol/L) 5.8 mmol/L  Hepatitis C antibody   Collection Time: 09/21/23  3:37 PM  Result Value Ref Range   Hepatitis C Ab NON-REACTIVE NON-REACTIVE  HIV Antibody (routine testing w rflx)   Collection Time: 09/21/23  3:37 PM  Result Value Ref Range   HIV 1&2 Ab, 4th Generation NON-REACTIVE NON-REACTIVE  Uric acid   Collection Time: 09/21/23  3:37 PM  Result Value Ref Range   Uric Acid, Serum 6.1 4.0 - 8.0 mg/dL          Assessment & Plan:   Problem List Items Addressed This Visit       Cardiovascular and Mediastinum   Primary hypertension - Primary   Relevant Medications   amLODipine  (NORVASC ) 10 MG tablet   atenolol  (TENORMIN ) 25 MG tablet   lisinopril  (ZESTRIL ) 40 MG tablet   Other Relevant Orders   Comprehensive metabolic panel with GFR   CBC with Differential/Platelet     Other   Gout   Relevant Medications   allopurinol  (ZYLOPRIM ) 100 MG tablet   colchicine  0.6 MG tablet   Other Relevant Orders   Comprehensive metabolic panel with GFR   Uric acid   Mixed hyperlipidemia   Relevant Medications   amLODipine  (NORVASC ) 10 MG tablet   atenolol  (TENORMIN ) 25 MG tablet   lisinopril  (ZESTRIL ) 40 MG tablet   Other Relevant Orders   Lipid panel   Other Visit Diagnoses       Screening for diabetes mellitus       Relevant Orders   Comprehensive metabolic panel with GFR   Hemoglobin A1c     Lipoma of face       Relevant Orders   Ambulatory referral to General Surgery     Class 1 obesity due to excess calories with serious comorbidity and body mass index (BMI) of 30.0 to 30.9 in adult       Relevant Medications    Naltrexone-buPROPion HCl ER (CONTRAVE) 8-90 MG TB12        Assessment and Plan Assessment & Plan Wellness Visit Routine wellness visit with a focus on weight management. Current weight is 210 pounds with a BMI of 30.19, indicating obesity. Discussed weight loss medications, including Wegovy and Contrave. He prefers oral medication due to aversion to injections. Contrave may also aid in reducing alcohol consumption due to its Wellbutrin component. - Prescribe Contrave for weight management with titration: start with one pill in the morning, increase weekly to two pills in the morning and two at night. - Provide information on specialty pharmacy [facility] for medication fulfillment. - Advise adequate hydration to prevent dizziness while on Contrave. - Discuss potential side effects of Contrave: headache, dry mouth, fatigue, and nausea. - Emphasize lifestyle modifications for effective weight loss. - Highlight potential benefit of Contrave in reducing alcohol consumption.  Elevated liver enzymes Elevated AST and ALT likely due to significant alcohol consumption. Reduction in alcohol intake is  necessary to improve liver function. - Recheck liver enzymes today. - Advise reducing alcohol intake to one cocktail and one bottle of wine per night.  Hypertension Hypertension is well-controlled with amlodipine , atenolol , and lisinopril . Current blood pressure is 128/80 mmHg. - Continue current antihypertensive regimen: amlodipine  10 mg daily, atenolol  12.5 mg daily, lisinopril  40 mg daily.  Gout Gout with three flare-ups in the past six months. Two mild episodes managed with colchicine , one severe episode required urgent care and prednisone . Currently on allopurinol  100 mg daily and colchicine  as needed. - Ensure refill of colchicine  for acute gout flare management.  Lipoma Lipoma on the right jawline is bothersome. He prefers surgical removal, with general surgery favored for faster  availability. - Refer to general surgery for lipoma removal.        Follow up plan: Return in about 6 months (around 10/24/2024) for follow up.

## 2024-04-24 ENCOUNTER — Ambulatory Visit: Payer: Self-pay | Admitting: Nurse Practitioner

## 2024-04-24 LAB — LIPID PANEL
Cholesterol: 258 mg/dL — ABNORMAL HIGH (ref <200)
HDL: 79 mg/dL (ref >=40)
LDL Cholesterol (Calc): 127 mg/dL — ABNORMAL HIGH
Non-HDL Cholesterol (Calc): 179 mg/dL — ABNORMAL HIGH (ref <130)
Total CHOL/HDL Ratio: 3.3 (calc) (ref <5.0)
Triglycerides: 364 mg/dL — ABNORMAL HIGH (ref <150)

## 2024-04-24 LAB — COMPREHENSIVE METABOLIC PANEL WITH GFR
AG Ratio: 2 (calc) (ref 1.0–2.5)
ALT: 35 U/L (ref 9–46)
AST: 33 U/L (ref 10–40)
Albumin: 4.8 g/dL (ref 3.6–5.1)
Alkaline phosphatase (APISO): 86 U/L (ref 36–130)
BUN: 19 mg/dL (ref 7–25)
CO2: 27 mmol/L (ref 20–32)
Calcium: 9.7 mg/dL (ref 8.6–10.3)
Chloride: 104 mmol/L (ref 98–110)
Creat: 0.87 mg/dL (ref 0.60–1.29)
Globulin: 2.4 g/dL (ref 1.9–3.7)
Glucose, Bld: 91 mg/dL (ref 65–99)
Potassium: 4.4 mmol/L (ref 3.5–5.3)
Sodium: 140 mmol/L (ref 135–146)
Total Bilirubin: 0.3 mg/dL (ref 0.2–1.2)
Total Protein: 7.2 g/dL (ref 6.1–8.1)
eGFR: 110 mL/min/{1.73_m2} (ref >=60)

## 2024-04-24 LAB — HEMOGLOBIN A1C
Hgb A1c MFr Bld: 5 % (ref <5.7)
Mean Plasma Glucose: 97 mg/dL
eAG (mmol/L): 5.4 mmol/L

## 2024-04-24 LAB — CBC WITH DIFFERENTIAL/PLATELET
Absolute Lymphocytes: 3050 {cells}/uL (ref 850–3900)
Absolute Monocytes: 679 {cells}/uL (ref 200–950)
Basophils Absolute: 47 {cells}/uL (ref 0–200)
Basophils Relative: 0.6 %
Eosinophils Absolute: 203 {cells}/uL (ref 15–500)
Eosinophils Relative: 2.6 %
HCT: 41.5 % (ref 38.5–50.0)
Hemoglobin: 14.6 g/dL (ref 13.2–17.1)
MCH: 32.4 pg (ref 27.0–33.0)
MCHC: 35.2 g/dL (ref 32.0–36.0)
MCV: 92.2 fL (ref 80.0–100.0)
MPV: 9.5 fL (ref 7.5–12.5)
Monocytes Relative: 8.7 %
Neutro Abs: 3822 {cells}/uL (ref 1500–7800)
Neutrophils Relative %: 49 %
Platelets: 243 10*3/uL (ref 140–400)
RBC: 4.5 10*6/uL (ref 4.20–5.80)
RDW: 11.9 % (ref 11.0–15.0)
Total Lymphocyte: 39.1 %
WBC: 7.8 10*3/uL (ref 3.8–10.8)

## 2024-04-24 LAB — URIC ACID: Uric Acid, Serum: 7.2 mg/dL (ref 4.0–8.0)

## 2024-04-30 ENCOUNTER — Ambulatory Visit: Payer: Self-pay | Admitting: Surgery

## 2024-04-30 ENCOUNTER — Encounter: Payer: Self-pay | Admitting: Surgery

## 2024-04-30 VITALS — BP 151/108 | HR 96 | Temp 98.9°F | Ht 70.0 in | Wt 208.8 lb

## 2024-04-30 DIAGNOSIS — D170 Benign lipomatous neoplasm of skin and subcutaneous tissue of head, face and neck: Secondary | ICD-10-CM

## 2024-04-30 NOTE — Progress Notes (Signed)
 04/30/2024  Reason for Visit:  Lipoma of right lower face  Requesting Provider:  Donny Gall, FNP  History of Present Illness: Jackson Mcguire. is a 43 y.o. male presenting for evaluation of a lipoma of the right lower face.  The patient reports having a mass in the right side at the jaw line, at the angle of the mandible.  He reports he's had this for many years, likely about 7 years.  He reports it has slowly grown in size.  Has not had any major issues, but some soreness.  Denies any erythema or drainage.  He has tried squeezing it but nothing has come out.  He has grown a beard to hide the mass.  Past Medical History: Past Medical History:  Diagnosis Date   Anxiety    Gout    Gout    Hypertension      Past Surgical History: Past Surgical History:  Procedure Laterality Date   APPENDECTOMY      Home Medications: Prior to Admission medications   Medication Sig Start Date End Date Taking? Authorizing Provider  acetaminophen  (TYLENOL ) 325 MG tablet Take by mouth. 09/29/17  Yes [provider]  allopurinol  (ZYLOPRIM ) 100 MG tablet Take 1 tablet (100 mg total) by mouth daily. 04/23/24  Yes Quinton Buckler, FNP  amLODipine  (NORVASC ) 10 MG tablet Take 1 tablet (10 mg total) by mouth daily. 04/23/24 07/22/24 Yes Quinton Buckler, FNP  atenolol  (TENORMIN ) 25 MG tablet Take 0.5 tablets (12.5 mg total) by mouth daily. 04/23/24 07/22/24 Yes Quinton Buckler, FNP  colchicine  0.6 MG tablet Day 1: Take 2 tablets (1.2 mg) at the first sign of flare, followed by 1 tablet (0.6 mg) after 1 hour; maximum total dose: 1.8 mg/day on day 1. Initiate as soon as possible, ideally within 12 to 24 hours of flare onset. Day 2 and thereafter: Take 1 tablet (0.6 mg) once or twice daily until flare resolves. 04/23/24  Yes Quinton Buckler, FNP  ibuprofen  (ADVIL ) 800 MG tablet Take 1 tablet (800 mg total) by mouth 3 (three) times daily. 08/12/22  Yes Brimage, Vondra, DO  lisinopril  (ZESTRIL ) 40 MG tablet Take 1  tablet (40 mg total) by mouth daily. 04/23/24 07/22/24 Yes Quinton Buckler, FNP  Naltrexone-buPROPion HCl ER (CONTRAVE ) 8-90 MG TB12 Start 1 tablet every morning for 7 days, then 1 tablet twice daily for 7 days, then 2 tablets every morning and one every evening 04/23/24  Yes Pender, Julie F, FNP    Allergies: No Known Allergies  Social History:  reports that he has been smoking cigarettes. He has never used smokeless tobacco. He reports current alcohol use. He reports that he does not use drugs.   Family History: Family History  Problem Relation Age of Onset   Hypertension Mother     Review of Systems: Review of Systems  Constitutional:  Negative for chills and fever.  Respiratory:  Negative for shortness of breath.   Cardiovascular:  Negative for chest pain.  Gastrointestinal:  Negative for nausea and vomiting.  Skin:        Mass at right angle of mandible.    Physical Exam BP (!) 151/108   Pulse 96   Temp 98.9 F (37.2 C) (Oral)   Ht 5\' 10"  (1.778 m)   Wt 208 lb 12.8 oz (94.7 kg)   SpO2 99%   BMI 29.96 kg/m  CONSTITUTIONAL: No acute distress, well nourished. HEENT:  Normocephalic, atraumatic, extraocular motion intact. RESPIRATORY:  Normal  respiratory effort without pathologic use of accessory muscles. CARDIOVASCULAR: Regular rhythm and rate. MUSCULOSKELETAL:  Normal muscle strength and tone in all four extremities.  No peripheral edema or cyanosis. SKIN: The patient has a 2 cm mass at the right jaw line, adjacent to the angle of the mandible.  It is soft, mobile, non-tender to palpation.  Unclear if there are any entry pores due to his facial hair. NEUROLOGIC:  Motor and sensation is grossly normal.  Cranial nerves are grossly intact. PSYCH:  Alert and oriented to person, place and time. Affect is normal.  Laboratory Analysis: No results found for this or any previous visit (from the past 24 hours).  Imaging: No results found.  Assessment and Plan: This is a 43  y.o. male with a right angle of mandible mass.  --Discussed with the patient that this could be a sebaceous cyst or a lipoma.  I do not see any entry pores on the skin, but hard to distinguish due to his facial hair.  Nonetheless, discussed that these are benign masses.  Due to growth and mild symptoms, reasonable to proceed with excision.  Discussed with him the option for office procedure under local anesthesia vs OR procedure under general anesthesia and he prefer office procedure.  Discussed planned incision, risks of bleeding, infection, injury to surrounding structures, and he's willing to proceed. --Will schedule for office procedure on 05/16/24.  I spent 30 minutes dedicated to the care of this patient on the date of this encounter to include pre-visit review of records, face-to-face time with the patient discussing diagnosis and management, and any post-visit coordination of care.   Marene Shape, MD [facility]

## 2024-04-30 NOTE — Patient Instructions (Signed)
 Lipoma  A lipoma is a noncancerous (benign) tumor that is made up of fat cells. This is a very common type of soft-tissue growth. Lipomas are usually found under the skin (subcutaneous). They may occur in any tissue of the body that contains fat. Common areas for lipomas to appear include the back, arms, shoulders, buttocks, and thighs. Lipomas grow slowly, and they are usually painless. Most lipomas do not cause problems and do not require treatment. What are the causes? The cause of this condition is not known. What increases the risk? You are more likely to develop this condition if: You are 72-50 years old. You have a family history of lipomas. What are the signs or symptoms? A lipoma usually appears as a small, round bump under the skin. In most cases, the lump will: Feel soft or rubbery. Not cause pain or other symptoms. However, if a lipoma is located in an area where it pushes on nerves, it can become painful or cause other symptoms. How is this diagnosed? A lipoma can usually be diagnosed with a physical exam. You may also have tests to confirm the diagnosis and to rule out other conditions. Tests may include: Imaging tests, such as a CT scan or an MRI. Removal of a tissue sample to be looked at under a microscope (biopsy). How is this treated? Treatment for this condition depends on the size of the lipoma and whether it is causing any symptoms. For small lipomas that are not causing problems, no treatment is needed. If a lipoma is bigger or it causes problems, surgery may be done to remove the lipoma. Lipomas can also be removed to improve appearance. Most often, the procedure is done after applying a medicine that numbs the area (local anesthetic). Liposuction may be done to reduce the size of the lipoma before it is removed through surgery, or it may be done to remove the lipoma. Lipomas are removed with this method to limit incision size and scarring. A liposuction tube is  inserted through a small incision into the lipoma, and the contents of the lipoma are removed through the tube with suction. Follow these instructions at home: Watch your lipoma for any changes. Keep all follow-up visits. This is important. Where to find more information OrthoInfo: orthoinfo.aaos.org Contact a health care provider if: Your lipoma becomes larger or hard. Your lipoma becomes painful, red, or increasingly swollen. These could be signs of infection or a more serious condition. Get help right away if: You develop tingling or numbness in an area near the lipoma. This could indicate that the lipoma is causing nerve damage. Summary A lipoma is a noncancerous tumor that is made up of fat cells. Most lipomas do not cause problems and do not require treatment. If a lipoma is bigger or it causes problems, surgery may be done to remove the lipoma. Contact a health care provider if your lipoma becomes larger or hard, or if it becomes painful, red, or increasingly swollen. These could be signs of infection or a more serious condition. This information is not intended to replace advice given to you by your health care provider. Make sure you discuss any questions you have with your health care provider.  Pocket of Fluid in the Skin (Epidermoid Cyst): What to Know   An epidermoid cyst is a small lump under your skin. The cyst contains a substance that is thick and oily. What are the causes? A blocked hair follicle. A hair curls and re-enters the skin instead  of growing straight out of the skin. A blocked pore. Irritated skin. An injury to the skin. Certain conditions that are passed from parent to child. Human papillomavirus (HPV). This happens rarely when cysts occur on the bottom of the feet. Long-term sun damage to the skin. What increases the risk? Having acne. Being male. Having an injury to the skin. Being past puberty. Certain conditions that are passed down through family  (genetic disorder). What are the signs or symptoms? The only sign of this type of cyst may be a small, painless lump under the skin. These cysts are usually painless, but they can get infected. Symptoms of infection may include: Redness. Inflammation. Tenderness. Warmth. Fever. A bad-smelling substance that drains from the cyst. Pus that drains from the cyst. How is this treated? If a cyst becomes inflamed, treatment may include: Opening and draining the cyst. Antibiotics. Shots of medicines called steroids that help lessen inflammation. Surgery to remove the cyst if it's large, painful, or could turn into cancer. Do not try to open or squeeze a cyst yourself. Follow these instructions at home: Medicines Take your medicines only as told. If you were given antibiotics, take them as told. Do not stop taking them even if you start to feel better. General instructions Keep the area around your cyst clean and dry. Wear loose, dry clothing. Avoid touching your cyst. Check your cyst every day for signs of infection. Check for: Redness, swelling, or pain. Fluid or blood. Warmth. Pus or a bad smell. Keep all follow-up visits to make sure there's no discomfort or infection. Contact a doctor if: You have any signs of infection. Your cyst doesn't get better or gets worse. You get a cyst that looks different from other cysts you've had. You have a fever. You have redness that spreads from the cyst. This information is not intended to replace advice given to you by your health care provider. Make sure you discuss any questions you have with your health care provider. Document Revised: 07/15/2023 Document Reviewed: 07/15/2023 Elsevier Patient Education  2024 [employer].

## 2024-05-16 ENCOUNTER — Ambulatory Visit: Payer: Self-pay | Admitting: Surgery

## 2024-05-16 ENCOUNTER — Encounter: Payer: Self-pay | Admitting: Surgery

## 2024-05-16 VITALS — BP 137/86 | HR 85 | Ht 70.0 in | Wt 210.0 lb

## 2024-05-16 DIAGNOSIS — L729 Follicular cyst of the skin and subcutaneous tissue, unspecified: Secondary | ICD-10-CM

## 2024-05-16 DIAGNOSIS — L723 Sebaceous cyst: Secondary | ICD-10-CM

## 2024-05-16 NOTE — Progress Notes (Signed)
  Procedure Date:  05/16/2024  Pre-operative Diagnosis:  Lipoma of right jaw line  Post-operative Diagnosis:  Sebaceous cyst of right jaw line, 1.8 cm  Procedure:  Excision of right jaw line sebaceous cyst; layered closure of 2 cm incision.  Surgeon:  Marene Shape, MD  Anesthesia:  5 ml 1% lidocaine  Estimated Blood Loss:  3 ml  Specimens:  Sebaceous cyst  Complications:  None  Indications for Procedure:  This is a 43 y.o. male with diagnosis of a symptomatic right jaw line mass, thought to be a lipoma.  The patient wishes to have this excised. The risks of bleeding, abscess or infection, injury to surrounding structures, and need for further procedures were all discussed with the patient and he was willing to proceed.  Description of Procedure: The patient was correctly identified at bedside.  The patient was placed supine.  Appropriate time-outs were performed.  The patient's right face around the angle of the mandible was prepped and draped in usual sterile fashion.  Local anesthetic was infused intradermally.  A 2 cm incision was made over the mass, and scalpel was used to dissect down the skin and subcutaneous tissue.  Skin flaps were created sharply, and then the mass was excised.  It was noted to be a sebaceous cyst instead of a lipoma as was initially suspected.  It was sent off to pathology.  The cavity was then irrigated and hemostasis was assured with manual pressure.  The wound was then closed in two layers using 3-0 Vicryl and 4-0 Monocryl.  The incision was cleaned and sealed with DermaBond.  The patient tolerated the procedure well and all sharps were appropriately disposed of at the end of the case.  --Patient may shower tomorrow --May take tylenol  or ibuprofen  for pain control --Follow up in about 10 days.   Marene Shape, MD

## 2024-05-16 NOTE — Patient Instructions (Signed)
 We have removed a Cyst in our office today.  You have sutures under the skin that will dissolve and also dermabond (skin glue) on top of your skin which will come off on it's own in 10-14 days.  You may use Ibuprofen  or Tylenol  as needed for pain control. Use the ice pack 3-4 times a day for the next two days for any achiness.  You may shower tomorrow. Do not scrub at the area.  Your incision was closed with Dermabond. It is best to keep it clean and dry, it will tolerate a brief shower, but do not soak it or apply any creams or lotions to the incisions. The Dermabond should gradually flake off over time. Keep it open to air so you can evaluate your incisions. Dermabond assists the underlying sutures to keep your incision closed and protected from infection. Should you develop some drainage from your incision, some drops of drainage would be okay but if it persists continue to put keep a dry dressing over it.  Avoid Strenuous activities that will make you sweat during the next 48 hours to avoid the glue coming off prematurely. Avoid activities that will place pressure to this area of the body for 1-2 weeks to avoid re-injury to incision site.  Please see your follow-up appointment provided. We will see you back in office to make sure this area is healed and to review the final pathology. If you have any questions or concerns prior to this appointment, call our office and speak with a nurse.    Excision of Skin Cysts or Lesions Excision of a skin lesion refers to the removal of a section of skin by making small cuts (incisions) in the skin. This procedure may be done to remove a cancerous (malignant) or noncancerous (benign) growth on the skin. It is typically done to treat or prevent cancer or infection. It may also be done to improve cosmetic appearance. The procedure may be done to remove: Cancerous growths, such as basal cell carcinoma, squamous cell carcinoma, or melanoma. Noncancerous  growths, such as a cyst or lipoma. Growths, such as moles or skin tags, which may be removed for cosmetic reasons.  Various excision or surgical techniques may be used depending on your condition, the location of the lesion, and your overall health. Tell a health care provider about: Any allergies you have. All medicines you are taking, including vitamins, herbs, eye drops, creams, and over-the-counter medicines. Any problems you or family members have had with anesthetic medicines. Any blood disorders you have. Any surgeries you have had. Any medical conditions you have. Whether you are pregnant or may be pregnant. What are the risks? Generally, this is a safe procedure. However, problems may occur, including: Bleeding. Infection. Scarring. Recurrence of the cyst, lipoma, or cancer. Changes in skin sensation or appearance, such as discoloration or swelling. Reaction to the anesthetics. Allergic reaction to surgical materials or ointments. Damage to nerves, blood vessels, muscles, or other structures. Continued pain.  What happens before the procedure? Ask your health care provider about: Changing or stopping your regular medicines. This is especially important if you are taking diabetes medicines or blood thinners. Taking medicines such as aspirin and ibuprofen . These medicines can thin your blood. Do not take these medicines before your procedure if your health care provider instructs you not to. You may be asked to take certain medicines. You may be asked to stop smoking. You may have an exam or testing. What happens during the procedure? To  reduce your risk of infection: Your health care team will wash or sanitize their hands. Your skin will be washed with soap. You will be given a medicine to numb the area (local anesthetic). One of the following excision techniques will be performed. At the end of any of these procedures, antibiotic ointment will be applied as needed. Each  of the following techniques may vary among health care providers and hospitals. Complete Surgical Excision The area of skin that needs to be removed will be marked with a pen. Using a small scalpel or scissors, the surgeon will gently cut around and under the lesion until it is completely removed. The lesion will be placed in a fluid and sent to the lab for examination. If necessary, bleeding will be controlled with a device that delivers heat (electrocautery). The edges of the wound may be stitched (sutured) together, and a bandage (dressing) or surgical glue will be applied. This procedure may be performed to treat a cancerous growth or a noncancerous cyst or lesion.  What happens after the procedure? Return to your normal activities as told by your health care provider. Report any excessive bleeding, spreading redness, or increased pain.

## 2024-05-22 LAB — SURGICAL PATHOLOGY

## 2024-05-28 ENCOUNTER — Encounter: Payer: Self-pay | Admitting: Surgery

## 2024-10-24 ENCOUNTER — Encounter: Payer: Self-pay | Admitting: Nurse Practitioner

## 2024-10-24 ENCOUNTER — Ambulatory Visit (INDEPENDENT_AMBULATORY_CARE_PROVIDER_SITE_OTHER): Payer: Self-pay | Admitting: Nurse Practitioner

## 2024-10-24 VITALS — BP 144/102 | HR 100 | Temp 98.5°F | Ht 70.0 in | Wt 207.0 lb

## 2024-10-24 DIAGNOSIS — I1A0 Resistant hypertension: Secondary | ICD-10-CM

## 2024-10-24 DIAGNOSIS — M1A9XX0 Chronic gout, unspecified, without tophus (tophi): Secondary | ICD-10-CM

## 2024-10-24 DIAGNOSIS — E6609 Other obesity due to excess calories: Secondary | ICD-10-CM | POA: Insufficient documentation

## 2024-10-24 DIAGNOSIS — Z6830 Body mass index (BMI) 30.0-30.9, adult: Secondary | ICD-10-CM

## 2024-10-24 DIAGNOSIS — E782 Mixed hyperlipidemia: Secondary | ICD-10-CM

## 2024-10-24 DIAGNOSIS — Z131 Encounter for screening for diabetes mellitus: Secondary | ICD-10-CM

## 2024-10-24 DIAGNOSIS — E66811 Obesity, class 1: Secondary | ICD-10-CM

## 2024-10-24 MED ORDER — CONTRAVE 8-90 MG PO TB12: ORAL_TABLET | ORAL | 5 refills | Status: AC

## 2024-10-24 MED ORDER — LISINOPRIL 40 MG PO TABS: 40.0000 mg | ORAL_TABLET | Freq: Every day | ORAL | 1 refills | Status: AC

## 2024-10-24 MED ORDER — HYDRALAZINE HCL 10 MG PO TABS: 10.0000 mg | ORAL_TABLET | Freq: Two times a day (BID) | ORAL | 0 refills | Status: AC | PRN

## 2024-10-24 MED ORDER — AMLODIPINE BESYLATE 10 MG PO TABS
10.0000 mg | ORAL_TABLET | Freq: Every day | ORAL | 1 refills | Status: AC
Start: 2024-10-24 — End: 2025-01-22

## 2024-10-24 MED ORDER — ALLOPURINOL 100 MG PO TABS: 100.0000 mg | ORAL_TABLET | Freq: Every day | ORAL | 1 refills | Status: AC

## 2024-10-24 MED ORDER — COLCHICINE 0.6 MG PO TABS: ORAL_TABLET | ORAL | 0 refills | Status: AC

## 2024-10-24 MED ORDER — PREDNISONE 10 MG (21) PO TBPK: ORAL_TABLET | ORAL | 3 refills | Status: AC

## 2024-10-24 MED ORDER — ATENOLOL 25 MG PO TABS
25.0000 mg | ORAL_TABLET | Freq: Every day | ORAL | 1 refills | Status: AC
Start: 2024-10-24 — End: 2025-01-22

## 2024-10-24 NOTE — Progress Notes (Signed)
 BP (!) 144/102   Pulse 100   Temp 98.5 F (36.9 C)   Ht 5' 10 (1.778 m)   Wt 207 lb (93.9 kg)   SpO2 96%   BMI 29.70 kg/m    Subjective:    Patient ID: Jackson Mcguire., male    DOB: 12-Nov-1981, 43 y.o.   MRN: [id number]  HPI: Jackson Mcguire. is a 43 y.o. male  Chief Complaint  Patient presents with   Medical Management of Chronic Issues    Would like to discuss flare ups of gout.    Discussed the use of AI scribe software for clinical note transcription with the patient, who gave verbal consent to proceed.  History of Present Illness Jackson Mcguire. is a 43 year old male with hypertension who presents with elevated blood pressure.  Hypertension - Elevated blood pressure measured at 150/102 mmHg during today's visit - Hypertension present since late twenties, persistent despite periods of abstaining from alcohol and tobacco - Current antihypertensive regimen includes amlodipine  10 mg daily, atenolol  25 mg daily, and lisinopril  40 mg daily - Adheres to medication regimen - Does not monitor blood pressure at home - No chest pain  Gout - History of gout managed with allopurinol  100 mg daily and colchicine  0.6 mg as needed - During flare-ups, increased colchicine  use can result in running out of medication - Prednisone  is very effective for acute flare-ups and desired for future management  Hyperlipidemia and weight management - Hyperlipidemia managed with dietary measures - Contrave  prescribed for weight loss, but not used recently - Weight recorded at 207 pounds - BMI is 29  Renal history - Hospitalization for kidney problems four years ago - Renal ultrasound performed during hospitalization         04/23/2024    3:28 PM 04/23/2024    3:27 PM 10/27/2023    3:20 PM  Depression screen PHQ 2/9  Decreased Interest 0 0 0  Down, Depressed, Hopeless 0 0 0  PHQ - 2 Score 0 0 0  Altered sleeping 0  0  Tired, decreased energy 0  0  Change in appetite 0  0   Feeling bad or failure about yourself  0  0  Trouble concentrating 0  0  Moving slowly or fidgety/restless 0  0  Suicidal thoughts 0  0  PHQ-9 Score 0   0   Difficult doing work/chores Not difficult at all  Not difficult at all     Data saved with a previous flowsheet row definition    Relevant past medical, surgical, family and social history reviewed and updated as indicated. Interim medical history since our last visit reviewed. Allergies and medications reviewed and updated.  Review of Systems  Constitutional: Negative for fever or weight change.  Respiratory: Negative for cough and shortness of breath.   Cardiovascular: Negative for chest pain or palpitations.  Gastrointestinal: Negative for abdominal pain, no bowel changes.  Musculoskeletal: Negative for gait problem or joint swelling.  Skin: Negative for rash.  Neurological: Negative for dizziness or headache.  No other specific complaints in a complete review of systems (except as listed in HPI above).      Objective:      BP (!) 144/102   Pulse 100   Temp 98.5 F (36.9 C)   Ht 5' 10 (1.778 m)   Wt 207 lb (93.9 kg)   SpO2 96%   BMI 29.70 kg/m    Wt Readings from Last 3  Encounters:  10/24/24 207 lb (93.9 kg)  05/16/24 210 lb (95.3 kg)  04/30/24 208 lb 12.8 oz (94.7 kg)    Physical Exam VITALS: BP- 150/102 MEASUREMENTS: Weight- 207, BMI- 29.0. GENERAL: Alert, cooperative, well developed, no acute distress HEENT: Normocephalic, normal oropharynx, moist mucous membranes CHEST: Clear to auscultation bilaterally, No wheezes, rhonchi, or crackles CARDIOVASCULAR: Normal heart rate and rhythm, S1 and S2 normal without murmurs ABDOMEN: Soft, non-tender, non-distended, without organomegaly, Normal bowel sounds EXTREMITIES: No cyanosis or edema NEUROLOGICAL: Cranial nerves grossly intact, Moves all extremities without gross motor or sensory deficit  Results for orders placed or performed in visit on 05/16/24   Surgical pathology   Collection Time: 05/16/24 12:00 AM  Result Value Ref Range   SURGICAL PATHOLOGY      SURGICAL PATHOLOGY [facility] [address] [place], [place] [address] Telephone [phone] or [phone] Fax [phone]  REPORT OF DERMATOPATHOLOGY   Accession #: [id number] Patient Name: Jackson, Mcguire Visit # : [id number]  MRN: [id number] Cytotechnologist: Charlene Rolla Mt, Dermatopathologist, Electronic Signature DOB/Age May 27, 1981 (Age: 49) Gender: M Collected Date: 05/16/2024 Received Date: 05/17/2024  FINAL DIAGNOSIS       1. Skin (M), right jaw :       EPIDERMOID CYST, COMPLETELY EXCISED       DATE SIGNED OUT: 05/22/2024 ELECTRONIC SIGNATURE : Charlene Rolla Mt, Dermatopathologist, Electronic Signature  MICROSCOPIC DESCRIPTION 1. There is a cyst lined by squamous epithelium with granular layer, containing loose lamellar keratin.  CASE COMMENTS STAINS USED IN DIAGNOSIS: H&E H&E H&E    CLINICAL HISTORY  SPECIMEN(S) OBTAINED 1. Skin (M), Right Jaw  SPECIMEN COMMENTS: SPECIMEN CLINICAL INFORMATION: 1. Sk in cyst    Gross Description 1. Shape:  Includes limited to no fat, no epidermis observed      Size:  20 x 15 x 15 mm      Lesion:  Clinically mentioned cyst observed      Orientation:  None; all margins inked      Block Summary: Specimen has been submitted in representative sections with 1      cross section in each A, B, and C cassettes septation. (alb)        Report signed out from the following location(s) [facility] [address] [place], [place] [address] CLIA #: [id number]  [facility] [address] [place], [place] [address] CLIA #: [id number]           Assessment & Plan:   Problem List Items Addressed This Visit       Cardiovascular and Mediastinum   Primary hypertension - Primary   Relevant Medications   amLODipine  (NORVASC ) 10 MG tablet    atenolol  (TENORMIN ) 25 MG tablet   lisinopril  (ZESTRIL ) 40 MG tablet   hydrALAZINE (APRESOLINE) 10 MG tablet     Other   Gout   Relevant Medications   allopurinol  (ZYLOPRIM ) 100 MG tablet   colchicine  0.6 MG tablet   predniSONE  (STERAPRED UNI-PAK 21 TAB) 10 MG (21) TBPK tablet   Other Relevant Orders   Uric acid   Mixed hyperlipidemia   Relevant Medications   amLODipine  (NORVASC ) 10 MG tablet   atenolol  (TENORMIN ) 25 MG tablet   lisinopril  (ZESTRIL ) 40 MG tablet   hydrALAZINE (APRESOLINE) 10 MG tablet   Other Relevant Orders   Lipid panel   Class 1 obesity due to excess calories with serious comorbidity and body mass index (BMI) of 30.0 to  30.9 in adult   Relevant Medications   Naltrexone-buPROPion HCl ER (CONTRAVE ) 8-90 MG TB12   Other Visit Diagnoses       Screening for diabetes mellitus       Relevant Orders   Comprehensive metabolic panel with GFR   Hemoglobin A1c        Assessment and Plan Assessment & Plan Resistant hypertension Blood pressure remains elevated at 150/102 despite maximum doses of lisinopril  and amlodipine , and a full tablet of atenolol . Previous kidney ultrasound was normal. GFR is good, but microalbumin urine test is planned to assess for kidney damage. Referral to nephrology is necessary due to resistant hypertension and family history of hypertension. - Rechecked blood pressure before leaving the clinic - Ordered microalbumin urine test - Referred to nephrology for further evaluation - Prescribed hydralazine as needed for blood pressure above 140/80 - Instructed to purchase a home blood pressure monitor and check blood pressure at lunchtime and dinnertime  Chronic gout without tophus Chronic gout with occasional minor flare-ups. Current management includes allopurinol  and colchicine  as needed. Colchicine  is effective but takes a few days to work. He requests a prednisone  taper for flare-ups, which is effective in resolving symptoms quickly. -  Prescribed prednisone  taper for gout flare-ups  Obesity, class 1 Obesity with a BMI of 29. He has not been taking Contrave  due to financial constraints but expresses a desire to restart it. He has lost one pound since the last visit. - Provided information on Contrave  and instructions for use - Encouraged restarting Contrave  for weight management        Follow up plan: Return in about 6 months (around 04/23/2025) for follow up.

## 2024-10-24 NOTE — Patient Instructions (Signed)
 Dr. Dennise nephrology Address: [address] [place], [place] [address] Phone: [phone]

## 2024-10-25 ENCOUNTER — Ambulatory Visit: Payer: Self-pay | Admitting: Nurse Practitioner

## 2024-10-25 ENCOUNTER — Other Ambulatory Visit: Payer: Self-pay | Admitting: Nurse Practitioner

## 2024-10-25 DIAGNOSIS — E782 Mixed hyperlipidemia: Secondary | ICD-10-CM

## 2024-10-25 MED ORDER — ROSUVASTATIN CALCIUM 10 MG PO TABS: 10.0000 mg | ORAL_TABLET | Freq: Every day | ORAL | 3 refills | Status: AC

## 2024-10-26 ENCOUNTER — Other Ambulatory Visit: Payer: Self-pay | Admitting: Nurse Practitioner

## 2024-10-26 DIAGNOSIS — E782 Mixed hyperlipidemia: Secondary | ICD-10-CM

## 2024-10-26 LAB — COMPREHENSIVE METABOLIC PANEL WITH GFR
AG Ratio: 2.1 (calc) (ref 1.0–2.5)
ALT: 23 U/L (ref 9–46)
AST: 30 U/L (ref 10–40)
Albumin: 4.8 g/dL (ref 3.6–5.1)
Alkaline phosphatase (APISO): 91 U/L (ref 36–130)
BUN: 15 mg/dL (ref 7–25)
CO2: 26 mmol/L (ref 20–32)
Calcium: 9.9 mg/dL (ref 8.6–10.3)
Chloride: 102 mmol/L (ref 98–110)
Creat: 1.17 mg/dL (ref 0.60–1.29)
Globulin: 2.3 g/dL (ref 1.9–3.7)
Glucose, Bld: 90 mg/dL (ref 65–139)
Potassium: 4.2 mmol/L (ref 3.5–5.3)
Sodium: 139 mmol/L (ref 135–146)
Total Bilirubin: 0.6 mg/dL (ref 0.2–1.2)
Total Protein: 7.1 g/dL (ref 6.1–8.1)
eGFR: 80 mL/min/1.73m2 (ref >=60)

## 2024-10-26 LAB — CBC WITH DIFFERENTIAL/PLATELET
Absolute Lymphocytes: 3258 {cells}/uL (ref 850–3900)
Absolute Monocytes: 801 {cells}/uL (ref 200–950)
Basophils Absolute: 36 {cells}/uL (ref 0–200)
Basophils Relative: 0.4 %
Eosinophils Absolute: 191 {cells}/uL (ref 15–500)
Eosinophils Relative: 2.1 %
HCT: 42.5 % (ref 38.5–50.0)
Hemoglobin: 15.1 g/dL (ref 13.2–17.1)
MCH: 32 pg (ref 27.0–33.0)
MCHC: 35.5 g/dL (ref 32.0–36.0)
MCV: 90 fL (ref 80.0–100.0)
MPV: 9.8 fL (ref 7.5–12.5)
Monocytes Relative: 8.8 %
Neutro Abs: 4814 {cells}/uL (ref 1500–7800)
Neutrophils Relative %: 52.9 %
Platelets: 280 Thousand/uL (ref 140–400)
RBC: 4.72 Million/uL (ref 4.20–5.80)
RDW: 11.7 % (ref 11.0–15.0)
Total Lymphocyte: 35.8 %
WBC: 9.1 Thousand/uL (ref 3.8–10.8)

## 2024-10-26 LAB — MICROALBUMIN / CREATININE URINE RATIO
Creatinine, Urine: 352 mg/dL — ABNORMAL HIGH (ref 20–320)
Microalb Creat Ratio: 2 mg/g{creat} (ref <30)
Microalb, Ur: 0.7 mg/dL

## 2024-10-26 LAB — URIC ACID: Uric Acid, Serum: 6.7 mg/dL (ref 4.0–8.0)

## 2024-10-26 LAB — HEMOGLOBIN A1C
Hgb A1c MFr Bld: 5.1 % (ref <5.7)
Mean Plasma Glucose: 100 mg/dL
eAG (mmol/L): 5.5 mmol/L

## 2024-10-26 LAB — LIPID PANEL
Cholesterol: 221 mg/dL — ABNORMAL HIGH (ref <200)
HDL: 64 mg/dL (ref >=40)
Non-HDL Cholesterol (Calc): 157 mg/dL — ABNORMAL HIGH (ref <130)
Total CHOL/HDL Ratio: 3.5 (calc) (ref <5.0)
Triglycerides: 792 mg/dL — ABNORMAL HIGH (ref <150)

## 2025-04-17 ENCOUNTER — Ambulatory Visit: Payer: Self-pay | Admitting: Nurse Practitioner
